# Patient Record
Sex: Male | Born: 1960 | Race: Black or African American | Hispanic: No | Marital: Single | Smoking: Former smoker
Health system: Southern US, Community
[De-identification: ages and names within clinical notes are randomized; demographics above are authoritative.]

## PROBLEM LIST (undated history)

## (undated) DIAGNOSIS — Q359 Cleft palate, unspecified: Secondary | ICD-10-CM

## (undated) DIAGNOSIS — Z789 Other specified health status: Secondary | ICD-10-CM

## (undated) HISTORY — PX: NASAL SINUS SURGERY: SHX719

## (undated) HISTORY — PX: INGUINAL HERNIA REPAIR: SUR1180

---

## 2011-07-06 ENCOUNTER — Emergency Department (HOSPITAL_COMMUNITY)
Admission: EM | Admit: 2011-07-06 | Discharge: 2011-07-06 | Disposition: A | Discharge status: Home / Self Care | Payer: Worker's Compensation | Attending: Physician Assistant | Admitting: Physician Assistant

## 2011-07-06 ENCOUNTER — Emergency Department (HOSPITAL_COMMUNITY): Payer: Worker's Compensation

## 2011-07-06 ENCOUNTER — Encounter: Payer: Self-pay | Admitting: Emergency Medicine

## 2011-07-06 DIAGNOSIS — IMO0002 Reserved for concepts with insufficient information to code with codable children: Secondary | ICD-10-CM

## 2011-07-06 DIAGNOSIS — M79609 Pain in unspecified limb: Secondary | ICD-10-CM | POA: Insufficient documentation

## 2011-07-06 DIAGNOSIS — M25579 Pain in unspecified ankle and joints of unspecified foot: Secondary | ICD-10-CM | POA: Insufficient documentation

## 2011-07-06 DIAGNOSIS — S93409A Sprain of unspecified ligament of unspecified ankle, initial encounter: Secondary | ICD-10-CM | POA: Insufficient documentation

## 2011-07-06 DIAGNOSIS — W11XXXA Fall on and from ladder, initial encounter: Secondary | ICD-10-CM | POA: Insufficient documentation

## 2011-07-06 MED ORDER — ONDANSETRON HCL 4 MG PO TABS
4.0000 mg | ORAL_TABLET | Freq: Once | ORAL | Status: AC
  Administered 2011-07-06: 4 mg via ORAL
  Filled 2011-07-06: qty 1

## 2011-07-06 MED ORDER — IBUPROFEN 800 MG PO TABS: 800.0000 mg | ORAL_TABLET | Freq: Three times a day (TID) | ORAL | Status: AC

## 2011-07-06 MED ORDER — MORPHINE SULFATE 10 MG/ML IJ SOLN
8.0000 mg | Freq: Once | INTRAMUSCULAR | Status: AC
  Administered 2011-07-06: 14:00:00 via INTRAMUSCULAR
  Filled 2011-07-06: qty 1

## 2011-07-06 MED ORDER — HYDROCODONE-ACETAMINOPHEN 5-325 MG PO TABS: ORAL_TABLET | ORAL | Status: DC

## 2011-07-06 NOTE — ED Notes (Signed)
Patient transported to X-ray 

## 2011-07-06 NOTE — ED Notes (Signed)
Crutch inst and aso applied. tol well,

## 2011-07-06 NOTE — ED Notes (Signed)
Pain rt ankle, good dp pulse, and sensation.  Iced and elevated.

## 2011-07-06 NOTE — ED Provider Notes (Signed)
History     CSN: [id number]  Arrival date & time 07/06/11  1258   None     Chief Complaint  Patient presents with  . Fall  . Leg Pain    (Consider location/radiation/quality/duration/timing/severity/associated sxs/prior treatment) Patient is a 50 y.o. male presenting with fall and leg pain. The history is provided by the patient.  Fall The accident occurred 1 to 2 hours ago. The fall occurred from a ladder. He fell from an unknown height. He landed on grass ( patient fell in a hole). Point of impact: Right ankle. The pain is at a severity of 10/10. The pain is severe. He was not ambulatory at the scene. There was no drug use involved in the accident. There was no alcohol use involved in the accident. Pertinent negatives include no visual change, no numbness, no abdominal pain, no nausea, no vomiting, no hematuria and no loss of consciousness. The symptoms are aggravated by standing. He has tried nothing for the symptoms.  Leg Pain  Pertinent negatives include no numbness.    Past Medical History  Diagnosis Date  . Hernia     Past Surgical History  Procedure Date  . Hernia repair     History reviewed. No pertinent family history.  History  Substance Use Topics  . Smoking status: Current Some Day Smoker    Types: Cigars  . Smokeless tobacco: Never Used  . Alcohol Use: 0.6 oz/week    1 Cans of beer per week      Review of Systems  Constitutional: Negative for activity change.       All ROS Neg except as noted in HPI  HENT: Negative for nosebleeds and neck pain.   Eyes: Negative for photophobia and discharge.  Respiratory: Negative for cough, shortness of breath and wheezing.   Cardiovascular: Negative for chest pain and palpitations.  Gastrointestinal: Negative for nausea, vomiting, abdominal pain and blood in stool.  Genitourinary: Negative for dysuria, frequency and hematuria.  Musculoskeletal: Negative for back pain and arthralgias.  Skin: Negative.     Neurological: Negative for dizziness, seizures, loss of consciousness, speech difficulty and numbness.  Psychiatric/Behavioral: Negative for hallucinations and confusion.    Allergies  Review of patient's allergies indicates no known allergies.  Home Medications   Current Outpatient Rx  Name Route Sig Dispense Refill  . HYDROCODONE-ACETAMINOPHEN 5-325 MG PO TABS  1 or 2 po q4h prn pain 24 tablet 0  . IBUPROFEN 800 MG PO TABS Oral Take 1 tablet (800 mg total) by mouth 3 (three) times daily. 21 tablet 0    BP 109/91  Pulse 76  Temp(Src) 97.8 F (36.6 C) (Tympanic)  Resp 18  Ht 5\' 9"  (1.753 m)  Wt 170 lb (77.111 kg)  BMI 25.10 kg/m2  SpO2 98%  Physical Exam  Nursing note and vitals reviewed. Constitutional: He is oriented to person, place, and time. He appears well-developed and well-nourished.  Non-toxic appearance.  HENT:  Head: Normocephalic.  Right Ear: Tympanic membrane and external ear normal.  Left Ear: Tympanic membrane and external ear normal.  Eyes: EOM and lids are normal. Pupils are equal, round, and reactive to light.  Neck: Normal range of motion. Neck supple. Carotid bruit is not present.  Cardiovascular: Normal rate, regular rhythm, normal heart sounds, intact distal pulses and normal pulses.   Pulmonary/Chest: Breath sounds normal. No respiratory distress.  Abdominal: Soft. Bowel sounds are normal. There is no tenderness. There is no guarding.  Musculoskeletal: Normal range of motion.  Moderate medial malleolus pain. Moderate posterior ankle pain. Achilles tendon on the left intact. Distal pulses symmetrical. Good capillary refill on the left. Sensory is symmetrical.  Lymphadenopathy:       Head (right side): No submandibular adenopathy present.       Head (left side): No submandibular adenopathy present.    He has no cervical adenopathy.  Neurological: He is alert and oriented to person, place, and time. He has normal strength. No cranial nerve deficit  or sensory deficit.  Skin: Skin is warm and dry.  Psychiatric: He has a normal mood and affect. His speech is normal.    ED Course  Procedures (including critical care time)  Labs Reviewed - No data to display Dg Tibia/fibula Right  07/06/2011  *RADIOLOGY REPORT*  Clinical Data: Larey Seat from ladder with pain  RIGHT TIBIA AND FIBULA - 2 VIEW  Comparison: None.  Findings: No acute fracture is seen.  Alignment is normal.  No significant soft tissue swelling is noted.  IMPRESSION: No fracture.  Original Report Authenticated By: Juline Patch, M.D.   Dg Ankle Complete Right  07/06/2011  *RADIOLOGY REPORT*  Clinical Data: Larey Seat from ladder  RIGHT ANKLE - COMPLETE 3+ VIEW  Comparison: None.  Findings: No acute fracture is seen.  The ankle joint appears normal.  Alignment is normal.  There may be a small ankle joint effusion present on the lateral view. A small bony dense on lateral view between the talus and navicula appears corticated and most likely is old.  IMPRESSION: No acute fracture.  Question small ankle joint effusion  Original Report Authenticated By: Juline Patch, M.D.     Dx: Ankle sprain   MDM  I have reviewed nursing notes, vital signs, and all appropriate lab and imaging results for this patient.        Kathie Dike, [place] 07/06/11 1438

## 2011-07-06 NOTE — ED Notes (Signed)
Patient c/o right lower leg/ankle pain. Patient reports falling off ladder into hole and twisting ankle. Patient denies falling or hurting any other part of body.

## 2011-07-06 NOTE — ED Notes (Signed)
Return from xray

## 2011-07-07 NOTE — ED Provider Notes (Signed)
Medical screening examination/treatment/procedure(s) were performed by non-physician practitioner and as supervising physician I was immediately available for consultation/collaboration.   Laray Anger, DO 07/07/11 [phone]

## 2012-04-11 ENCOUNTER — Emergency Department (HOSPITAL_COMMUNITY)
Admission: EM | Admit: 2012-04-11 | Discharge: 2012-04-11 | Disposition: A | Discharge status: Home / Self Care | Payer: Self-pay | Attending: Emergency Medicine | Admitting: Emergency Medicine

## 2012-04-11 ENCOUNTER — Emergency Department (HOSPITAL_COMMUNITY): Payer: Self-pay

## 2012-04-11 ENCOUNTER — Encounter (HOSPITAL_COMMUNITY): Payer: Self-pay

## 2012-04-11 DIAGNOSIS — M25469 Effusion, unspecified knee: Secondary | ICD-10-CM | POA: Insufficient documentation

## 2012-04-11 DIAGNOSIS — F17200 Nicotine dependence, unspecified, uncomplicated: Secondary | ICD-10-CM | POA: Insufficient documentation

## 2012-04-11 DIAGNOSIS — M25461 Effusion, right knee: Secondary | ICD-10-CM

## 2012-04-11 MED ORDER — KETOROLAC TROMETHAMINE 60 MG/2ML IM SOLN
60.0000 mg | Freq: Once | INTRAMUSCULAR | Status: AC
  Administered 2012-04-11: 60 mg via INTRAMUSCULAR
  Filled 2012-04-11: qty 2

## 2012-04-11 MED ORDER — ONDANSETRON HCL 4 MG PO TABS
4.0000 mg | ORAL_TABLET | Freq: Once | ORAL | Status: AC
  Administered 2012-04-11: 4 mg via ORAL
  Filled 2012-04-11: qty 1

## 2012-04-11 MED ORDER — MELOXICAM 7.5 MG PO TABS: ORAL_TABLET | ORAL | Status: DC

## 2012-04-11 MED ORDER — HYDROCODONE-ACETAMINOPHEN 5-325 MG PO TABS
2.0000 | ORAL_TABLET | Freq: Once | ORAL | Status: AC
  Administered 2012-04-11: 2 via ORAL
  Filled 2012-04-11: qty 2

## 2012-04-11 MED ORDER — HYDROCODONE-ACETAMINOPHEN 5-325 MG PO TABS: 1.0000 | ORAL_TABLET | ORAL | Status: DC | PRN

## 2012-04-11 NOTE — ED Provider Notes (Signed)
History     CSN: [id number]  Arrival date & time 04/11/12  2120   First MD Initiated Contact with Patient 04/11/12 2133      Chief Complaint  Patient presents with  . Knee Pain    (Consider location/radiation/quality/duration/timing/severity/associated sxs/prior treatment) Patient is a 51 y.o. male presenting with knee pain. The history is provided by the patient.  Knee Pain This is a chronic (This has been a problem "off and on" for 8 months.) problem. The current episode started more than 1 month ago. The problem occurs daily. The problem has been gradually worsening. Associated symptoms include arthralgias. Pertinent negatives include no abdominal pain, chest pain, coughing, fever, nausea or neck pain. The symptoms are aggravated by bending, standing and walking. He has tried acetaminophen for the symptoms. The treatment provided no relief.    Past Medical History  Diagnosis Date  . Hernia     Past Surgical History  Procedure Date  . Hernia repair     History reviewed. No pertinent family history.  History  Substance Use Topics  . Smoking status: Current Some Day Smoker    Types: Cigars  . Smokeless tobacco: Never Used  . Alcohol Use: 0.6 oz/week    1 Cans of beer per week      Review of Systems  Constitutional: Negative for fever and activity change.       All ROS Neg except as noted in HPI  HENT: Negative for nosebleeds and neck pain.   Eyes: Negative for photophobia and discharge.  Respiratory: Negative for cough, shortness of breath and wheezing.   Cardiovascular: Negative for chest pain and palpitations.  Gastrointestinal: Negative for nausea, abdominal pain and blood in stool.  Genitourinary: Negative for dysuria, frequency and hematuria.  Musculoskeletal: Positive for arthralgias. Negative for back pain.  Skin: Negative.   Neurological: Negative for dizziness, seizures and speech difficulty.  Psychiatric/Behavioral: Negative for hallucinations and  confusion.    Allergies  Review of patient's allergies indicates no known allergies.  Home Medications   Current Outpatient Rx  Name Route Sig Dispense Refill  . HYDROCODONE-ACETAMINOPHEN 5-325 MG PO TABS  1 or 2 po q4h prn pain 24 tablet 0  . HYDROCODONE-ACETAMINOPHEN 5-325 MG PO TABS Oral Take 1 tablet by mouth every 4 (four) hours as needed for pain. 20 tablet 0  . MELOXICAM 7.5 MG PO TABS  1 po bid with food 12 tablet 0    BP 113/78  Pulse 85  Temp 98.1 F (36.7 C) (Oral)  Resp 20  Ht 5\' 8"  (1.727 m)  Wt 170 lb (77.111 kg)  BMI 25.85 kg/m2  SpO2 98%  Physical Exam  Nursing note and vitals reviewed. Constitutional: He is oriented to person, place, and time. He appears well-developed and well-nourished.  Non-toxic appearance.  HENT:  Head: Normocephalic.  Right Ear: Tympanic membrane and external ear normal.  Left Ear: Tympanic membrane and external ear normal.  Eyes: EOM and lids are normal. Pupils are equal, round, and reactive to light.  Neck: Normal range of motion. Neck supple. Carotid bruit is not present.  Cardiovascular: Normal rate, regular rhythm, intact distal pulses and normal pulses.   Murmur heard. Pulmonary/Chest: Breath sounds normal. No respiratory distress.  Abdominal: Soft. Bowel sounds are normal. There is no tenderness. There is no guarding.  Musculoskeletal: Normal range of motion.       There is an effusion of the right knee. The knee is warm to touch but not hot. There is mild  to moderate crepitus with flexion and extension of the knee. There is no posterior mass appreciated. There's no deformity of the quadricep area nor the anterior tibial tuberosity. The dorsalis pedis pulses are symmetrical.  Lymphadenopathy:       Head (right side): No submandibular adenopathy present.       Head (left side): No submandibular adenopathy present.    He has no cervical adenopathy.  Neurological: He is alert and oriented to person, place, and time. He has normal  strength. No cranial nerve deficit or sensory deficit.  Skin: Skin is warm and dry.  Psychiatric: He has a normal mood and affect. His speech is normal.    ED Course  Procedures (including critical care time)  Labs Reviewed - No data to display No results found.   1. Knee effusion, right       MDM  I have reviewed nursing notes, vital signs, and all appropriate lab and imaging results for this patient. Patient reports that for approximately 8 months he has been having" off and on" pain and at times mild swelling of the right knee. He has had some previous problems with his right ankle as well. The patient has not had this evaluated up to this point. The patient denies any high fevers or chills recently. His been no injury or trauma to the knee. The patient states he does a lot of walking and bending.  The plan at this time is for the patient to be fitted with a knee immobilizer. He history with Mobic 7.5 mg 2 times daily with food, and Norco 5 mg #20 tablets. The patient is asked to see the orthopedist for additional evaluation and management of this effusion and the source of the effusion.       Kathie Dike, [place] 04/11/12 2154

## 2012-04-11 NOTE — ED Notes (Signed)
Patient with no complaints at this time. Respirations even and unlabored. Skin warm/dry. Discharge instructions reviewed with patient at this time. Patient given opportunity to voice concerns/ask questions. Patient discharged at this time and left Emergency Department with steady gait.   

## 2012-04-11 NOTE — ED Notes (Signed)
Patient fell in December of last year and injured R ankle.  He has had mild knee pain since that time.  Recently it has become more severe and has become tight and swollen.  Knee is swollen and warm.

## 2012-04-11 NOTE — ED Notes (Signed)
Right knee swollen and hurting per pt.

## 2012-04-13 NOTE — ED Provider Notes (Signed)
Medical screening examination/treatment/procedure(s) were performed by non-physician practitioner and as supervising physician I was immediately available for consultation/collaboration.  Flint Melter, MD 04/13/12 1257

## 2013-11-20 ENCOUNTER — Encounter (HOSPITAL_COMMUNITY): Payer: Self-pay | Admitting: Emergency Medicine

## 2013-11-20 ENCOUNTER — Emergency Department (HOSPITAL_COMMUNITY)
Admission: EM | Admit: 2013-11-20 | Discharge: 2013-11-20 | Disposition: A | Discharge status: Home / Self Care | Payer: BC Managed Care – PPO | Attending: Emergency Medicine | Admitting: Emergency Medicine

## 2013-11-20 DIAGNOSIS — K429 Umbilical hernia without obstruction or gangrene: Secondary | ICD-10-CM

## 2013-11-20 DIAGNOSIS — N5089 Other specified disorders of the male genital organs: Secondary | ICD-10-CM | POA: Insufficient documentation

## 2013-11-20 DIAGNOSIS — K4090 Unilateral inguinal hernia, without obstruction or gangrene, not specified as recurrent: Secondary | ICD-10-CM | POA: Insufficient documentation

## 2013-11-20 DIAGNOSIS — F17200 Nicotine dependence, unspecified, uncomplicated: Secondary | ICD-10-CM | POA: Insufficient documentation

## 2013-11-20 MED ORDER — HYDROCODONE-ACETAMINOPHEN 5-325 MG PO TABS: 1.0000 | ORAL_TABLET | ORAL | Status: DC | PRN

## 2013-11-20 NOTE — ED Provider Notes (Signed)
CSN: [id number]     Arrival date & time 11/20/13  1930 History   First MD Initiated Contact with Patient 11/20/13 2230     Chief Complaint  Patient presents with  . Inguinal Hernia     (Consider location/radiation/quality/duration/timing/severity/associated sxs/prior Treatment) Patient is a 53 y.o. male presenting with male genitourinary complaint. The history is provided by the patient.  Male GU Problem Presenting symptoms: no penile pain and no scrotal pain   Presenting symptoms comment:  Left groin pain Relieved by:  Nothing Worsened by:  Nothing tried Ineffective treatments:  None tried Associated symptoms: scrotal swelling   Associated symptoms: no abdominal pain   Risk factors comment:  Inguiinal hernia on left   Past Medical History  Diagnosis Date  . Hernia    Past Surgical History  Procedure Laterality Date  . Hernia repair     No family history on file. History  Substance Use Topics  . Smoking status: Current Some Day Smoker    Types: Cigars, Cigarettes  . Smokeless tobacco: Never Used  . Alcohol Use: 0.6 oz/week    1 Cans of beer per week    Review of Systems  Gastrointestinal: Negative for abdominal pain.  Genitourinary: Positive for scrotal swelling. Negative for penile pain.  All other systems reviewed and are negative.     Allergies  Review of patient's allergies indicates no known allergies.  Home Medications   Prior to Admission medications   Not on File   BP 126/73  Pulse 68  Temp(Src) 98.3 F (36.8 C) (Oral)  Resp 20  Ht 5\' 5"  (1.651 m)  Wt 153 lb (69.4 kg)  BMI 25.46 kg/m2  SpO2 99% Physical Exam  Constitutional: He is oriented to person, place, and time. He appears well-developed and well-nourished. No distress.  HENT:  Head: Normocephalic and atraumatic.  Eyes: Conjunctivae are normal.  Neck: Neck supple. No tracheal deviation present.  Cardiovascular: Normal rate and regular rhythm.   Pulmonary/Chest: Effort normal. No  respiratory distress.  Abdominal: Soft. He exhibits no distension. There is no tenderness. A hernia is present. Hernia confirmed positive in the ventral area (minimal just superior to umbilicus) and confirmed positive in the left inguinal area (reducible).  Neurological: He is alert and oriented to person, place, and time.  Skin: Skin is warm and dry.  Psychiatric: He has a normal mood and affect.    ED Course  Procedures (including critical care time) Labs Review Labs Reviewed - No data to display  Imaging Review No results found.   EKG Interpretation None      MDM   Final diagnoses:  Left inguinal hernia  Periumbilical hernia   53 y.o. male presents with left inguinal hernia that he has noticed becoming larger over the last few years. He has had one on the right side remotely that was repaired surgically. Also states has "knot" near umbilicus. Palpable bulging left hernia without tenderness and easily reducible. No ischemic pain, no indication for lab work or urgent surgical consultation. Uncomplicated early periumbilical hernia that is painless. Recommended Pt see a general surgeon non-emergently for evaluation of symptomatic inguinal hernia and can establish primary care physician to track umbilical hernia that will not require surgical repair as is asymptomatic currently. Discharged in good condition with short course of pain meds for symptomatic control during activities that exacerbate pain.    Leo Grosser, MD 11/21/13 [phone]

## 2013-11-20 NOTE — ED Notes (Signed)
Pt reports left inguinal hernia x "5 months" but states it has progressively gotten larger and states pain is increasing. Rates 5/10. States "I want to get check out." Denies being seen for same. Denies N/V/D. NAD. AO x4.

## 2013-11-20 NOTE — Discharge Instructions (Signed)

## 2013-11-21 NOTE — ED Provider Notes (Signed)
I saw and evaluated the patient, reviewed the resident's note and I agree with the findings and plan.   EKG Interpretation None      Reducible inguinal hernia.  Patient be referred to general surgery.  Hoy Morn, MD 11/21/13 [phone]

## 2013-12-03 ENCOUNTER — Ambulatory Visit: Payer: Self-pay

## 2013-12-12 ENCOUNTER — Ambulatory Visit (INDEPENDENT_AMBULATORY_CARE_PROVIDER_SITE_OTHER): Payer: BC Managed Care – PPO | Admitting: Family Medicine

## 2013-12-12 ENCOUNTER — Encounter: Payer: Self-pay | Admitting: Family Medicine

## 2013-12-12 VITALS — BP 122/75 | HR 73 | Temp 98.0°F | Resp 20 | Ht 69.0 in | Wt 151.0 lb

## 2013-12-12 DIAGNOSIS — R5383 Other fatigue: Secondary | ICD-10-CM

## 2013-12-12 DIAGNOSIS — J3489 Other specified disorders of nose and nasal sinuses: Secondary | ICD-10-CM

## 2013-12-12 DIAGNOSIS — R5381 Other malaise: Secondary | ICD-10-CM

## 2013-12-12 DIAGNOSIS — Z1211 Encounter for screening for malignant neoplasm of colon: Secondary | ICD-10-CM

## 2013-12-12 DIAGNOSIS — R0981 Nasal congestion: Secondary | ICD-10-CM

## 2013-12-12 DIAGNOSIS — K4090 Unilateral inguinal hernia, without obstruction or gangrene, not specified as recurrent: Secondary | ICD-10-CM

## 2013-12-12 LAB — CBC
HCT: 44.7 % (ref 39.0–52.0)
HEMOGLOBIN: 15.4 g/dL (ref 13.0–17.0)
MCH: 29.5 pg (ref 26.0–34.0)
MCHC: 34.5 g/dL (ref 30.0–36.0)
MCV: 85.6 fL (ref 78.0–100.0)
PLATELETS: 242 10*3/uL (ref 150–400)
RBC: 5.22 MIL/uL (ref 4.22–5.81)
RDW: 14.5 % (ref 11.5–15.5)
WBC: 5.4 10*3/uL (ref 4.0–10.5)

## 2013-12-12 NOTE — Progress Notes (Signed)
Subjective:    Patient ID: Adam Gonzalez, male    DOB: 12/28/1960, 53 y.o.   MRN: [id number]  HPI Patient is in the office to establish care. Patient reports that he moved to this area from [place] 2 years ago, and has not had a primary physician.  Patient complaining of a left inguinal hernia. Reports that he was examined by the  emergency room on 11/20/2013. States that the hernia was reduced and he was sent home with pain medication. Patient reports that he did not get the prescription filled. He states that discomfort primarily occurs with strenuous activity. He is currently not complaining of pain.   Review of Systems  Constitutional: Positive for fatigue. Negative for fever and unexpected weight change.  HENT: Negative.   Eyes: Negative.   Respiratory: Negative.   Cardiovascular: Negative.   Gastrointestinal: Negative.   Endocrine: Negative.   Genitourinary: Negative.  Negative for urgency.  Musculoskeletal: Negative.   Skin: Negative.   Allergic/Immunologic: Negative.   Neurological: Negative.   Hematological: Negative.   Psychiatric/Behavioral: Negative.        Objective:   Physical Exam  Constitutional: He is oriented to person, place, and time. He appears well-developed and well-nourished.  HENT:  Head: Normocephalic and atraumatic.  Right Ear: Hearing, tympanic membrane and external ear normal.  Left Ear: Hearing, tympanic membrane and external ear normal.  Nose: Mucosal edema and nasal deformity present. No sinus tenderness.  Mouth/Throat: Uvula is midline, oropharynx is clear and moist and mucous membranes are normal.  Eyes: Conjunctivae, EOM and lids are normal. Pupils are equal, round, and reactive to light. Lids are everted and swept, no foreign bodies found.  Neck: Trachea normal and normal range of motion. Neck supple.  Pulmonary/Chest: Effort normal and breath sounds normal.  Abdominal: Soft. Bowel sounds are normal. There is tenderness in the right upper  quadrant. There is no CVA tenderness. A hernia is present. Hernia confirmed positive in the left inguinal area.  Genitourinary: Testes normal and penis normal.    Circumcised.  Musculoskeletal: Normal range of motion.  Neurological: He is alert and oriented to person, place, and time. He has normal reflexes.  Skin: Skin is warm, dry and intact.  Psychiatric: He has a normal mood and affect. His speech is normal and behavior is normal.          Assessment & Plan:  1. Left Injuinal hernia: Patient states that hernia causes discomfort and is present with coughing, laughing and exercising. Reports that he has been refraining from exercising over the past month due to discomfort. Patient was seen in the emergency department and prescribed Percocet. He felt that the discomfort did not warrant that type of medication, so he did not have it filled. Patient reports that he had a right hernia repair years ago and is concerned that he may have to have a repair on the left. Patient may require a surgical consult. Will consult with Dr. Zigmund Daniel.   2. Nasal congestion: Reports that he has nasal congestion primarily upon awakening, unrelieved by saline. Given sample of Astepro 0.15%, one spray to each nare daily as needed.   3. Fatigue-Patient maintains that he feels increasingly tired on most days. He has not had a physical examination in greater than 2 years. Will check TSH, CMP, and CBC.    Preventions:  Immunizations: Up to date. Patient reports that he had a TDap 6 years ago.   Colonoscopy: Patient has never had colonoscopy,  will refer  for a screening colonoscopy  Labs: TSH, CMP, CBC  Will RTC in 3 months for CPE with Dr. Zigmund Daniel  Dorena Dew, FNP

## 2013-12-13 LAB — COMPLETE METABOLIC PANEL WITH GFR
ALBUMIN: 4.2 g/dL (ref 3.5–5.2)
ALT: 14 U/L (ref 0–53)
AST: 18 U/L (ref 0–37)
Alkaline Phosphatase: 51 U/L (ref 39–117)
BUN: 14 mg/dL (ref 6–23)
CALCIUM: 9.4 mg/dL (ref 8.4–10.5)
CHLORIDE: 102 meq/L (ref 96–112)
CO2: 25 mEq/L (ref 19–32)
Creat: 0.98 mg/dL (ref 0.50–1.35)
GFR, Est African American: >89 mL/min
GFR, Est Non African American: 89 mL/min
Glucose, Bld: 85 mg/dL (ref 70–99)
POTASSIUM: 4.2 meq/L (ref 3.5–5.3)
Sodium: 140 mEq/L (ref 135–145)
Total Bilirubin: 0.6 mg/dL (ref 0.2–1.2)
Total Protein: 6.9 g/dL (ref 6.0–8.3)

## 2013-12-13 LAB — TSH: TSH: 0.916 u[IU]/mL (ref 0.350–4.500)

## 2013-12-15 ENCOUNTER — Telehealth (HOSPITAL_COMMUNITY): Payer: Self-pay | Admitting: Family Medicine

## 2013-12-15 NOTE — Telephone Encounter (Signed)
Reviewed labs, notified patient.

## 2013-12-16 ENCOUNTER — Telehealth: Payer: Self-pay

## 2013-12-16 NOTE — Telephone Encounter (Signed)
Pt was contacted and told of his Labs being within good standing.Pt understood results w/ no problem.Pt was also told that when Referrals are sent off those offices would notify him of date /Time

## 2013-12-17 DIAGNOSIS — R5381 Other malaise: Secondary | ICD-10-CM | POA: Insufficient documentation

## 2013-12-17 DIAGNOSIS — Z1211 Encounter for screening for malignant neoplasm of colon: Secondary | ICD-10-CM | POA: Insufficient documentation

## 2013-12-17 DIAGNOSIS — R0981 Nasal congestion: Secondary | ICD-10-CM | POA: Insufficient documentation

## 2013-12-17 DIAGNOSIS — K4090 Unilateral inguinal hernia, without obstruction or gangrene, not specified as recurrent: Secondary | ICD-10-CM | POA: Insufficient documentation

## 2013-12-17 DIAGNOSIS — R5383 Other fatigue: Secondary | ICD-10-CM

## 2013-12-17 MED ORDER — AZELASTINE HCL 0.15 % NA SOLN: 1.0000 | Freq: Once | NASAL | Status: DC

## 2014-01-19 ENCOUNTER — Telehealth: Payer: Self-pay

## 2014-01-19 NOTE — Telephone Encounter (Signed)
Pt's Notes, Demo, Labs were all faxed over to [place] is in need of Colonoscopy.Pt will be contacted when Paper work has been recieved as well as reviewed.

## 2014-01-29 ENCOUNTER — Telehealth: Payer: Self-pay

## 2014-01-29 ENCOUNTER — Telehealth: Payer: Self-pay | Admitting: Internal Medicine

## 2014-01-29 DIAGNOSIS — K4090 Unilateral inguinal hernia, without obstruction or gangrene, not specified as recurrent: Secondary | ICD-10-CM

## 2014-01-29 NOTE — Telephone Encounter (Signed)
Fax was recieved today @ this office notifying about above Pt. having recieved appointment to see Dr. Hung@10 :20 AM(01/29/2014)@[place]  [place] stated Pt was Advised of this.

## 2014-01-29 NOTE — Telephone Encounter (Signed)
Patient called stating Dr Benson Norway was unable to complete colonoscopy due to existing hernia. Patient would like referrals to have hernia removed and existing back pain.

## 2014-01-29 NOTE — Telephone Encounter (Signed)
Call Documentation     Glori Bickers at 01/29/2014 2:39 PM     Status: Signed        Patient called stating Dr Benson Norway was unable to complete colonoscopy due to existing hernia. Patient would like referrals to have hernia removed and existing back pain.    Plz see existing Notations prior to this Msg.Thank you.

## 2014-02-03 ENCOUNTER — Telehealth: Payer: Self-pay

## 2014-02-03 NOTE — Telephone Encounter (Signed)
Called and spoke with patient to let him know we were following up on his previous referral to General Surgery. He states he already has an appointment with Dr. Fanny Skates on 02/19/2014 for Consult. Thanks!

## 2014-02-03 NOTE — Telephone Encounter (Signed)
Called and spoke with patient to let him know we were following up on his previous referral to General Surgery. He states he already has an appointment with Dr. Fanny Skates on 02/19/2014 for  Consult. Thanks!

## 2014-02-19 ENCOUNTER — Encounter (INDEPENDENT_AMBULATORY_CARE_PROVIDER_SITE_OTHER): Payer: Self-pay | Admitting: General Surgery

## 2014-02-19 ENCOUNTER — Ambulatory Visit (INDEPENDENT_AMBULATORY_CARE_PROVIDER_SITE_OTHER): Payer: BC Managed Care – PPO | Admitting: General Surgery

## 2014-02-19 ENCOUNTER — Other Ambulatory Visit (INDEPENDENT_AMBULATORY_CARE_PROVIDER_SITE_OTHER): Payer: Self-pay

## 2014-02-19 ENCOUNTER — Telehealth (INDEPENDENT_AMBULATORY_CARE_PROVIDER_SITE_OTHER): Payer: Self-pay | Admitting: General Surgery

## 2014-02-19 VITALS — BP 136/76 | HR 77 | Temp 98.0°F | Resp 18 | Ht 71.0 in | Wt 150.0 lb

## 2014-02-19 DIAGNOSIS — K4090 Unilateral inguinal hernia, without obstruction or gangrene, not specified as recurrent: Secondary | ICD-10-CM

## 2014-02-19 DIAGNOSIS — R109 Unspecified abdominal pain: Secondary | ICD-10-CM

## 2014-02-19 MED ORDER — HYDROCODONE-ACETAMINOPHEN 5-325 MG PO TABS: 1.0000 | ORAL_TABLET | Freq: Four times a day (QID) | ORAL | Status: DC | PRN

## 2014-02-19 NOTE — Progress Notes (Signed)
Patient ID: Adam Gonzalez, male   DOB: Jul 06, 1961, 53 y.o.   MRN: [id number]  Chief Complaint  Patient presents with  . New Evaluation    hernia    HPI Adam Gonzalez is a 53 y.o. male.  He is referred by Adam Sickle, FNP for evaluation of a painful left inguinal hernia  The patient states that he's had a painful bulge in his left groin for about 8 months. It's getting bigger and is having trouble working. When he was examined and they told him that he might have an umbilical hernia but he has no pain or symptoms there.  Has a history of right inguinal hernia repair 20 years ago. He does not know the details. He does not remember having any surgery at his umbilicus.  He is otherwise healthy.Does not have a Gonzalez cell disease.  He is single,  has 4 children. Smokes rarely. Drinks about 6 beers a week. 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  HPI  Past Medical History  Diagnosis Date  . Hernia     Past Surgical History  Procedure Laterality Date  . Hernia repair      No family history on file.  Social History History  Substance Use Topics  . Smoking status: Current Some Day Smoker    Types: Cigars, Cigarettes  . Smokeless tobacco: Never Used  . Alcohol Use: 0.6 oz/week    1 Cans of beer per week    No Known Allergies  Current Outpatient Prescriptions  Medication Sig Dispense Refill  . Azelastine HCl (ASTEPRO) 0.15 % SOLN Place 1 spray into the nose once.      Marland Kitchen HYDROcodone-acetaminophen (NORCO/VICODIN) 5-325 MG per tablet Take 1 tablet by mouth every 4 (four) hours as needed for moderate pain.  6 tablet  0   No current facility-administered medications for this visit.    Review of Systems Review of Systems  Constitutional: Negative for fever, chills and unexpected weight change.  HENT: Negative for congestion, hearing loss, sore throat, trouble swallowing and voice change.   Eyes: Negative for visual disturbance.  Respiratory: Negative for  cough and wheezing.   Cardiovascular: Negative for chest pain, palpitations and leg swelling.  Gastrointestinal: Negative for nausea, vomiting, abdominal pain, diarrhea, constipation, blood in stool, abdominal distention, anal bleeding and rectal pain.  Genitourinary: Negative for hematuria and difficulty urinating.  Musculoskeletal: Negative for arthralgias.  Skin: Negative for rash and wound.  Neurological: Negative for seizures, syncope, weakness and headaches.  Hematological: Negative for adenopathy. Does not bruise/bleed easily.  Psychiatric/Behavioral: Negative for confusion.    Blood pressure 136/76, pulse 77, temperature 98 F (36.7 C), resp. rate 18, height 5\' 11"  (1.803 m), weight 150 lb (68.04 kg).  Physical Exam Physical Exam  Constitutional: He is oriented to person, place, and time. He appears well-developed and well-nourished. No distress.  HENT:  Head: Normocephalic.  Nose: Nose normal.  Mouth/Throat: No oropharyngeal exudate.  Eyes: Conjunctivae and EOM are normal. Pupils are equal, round, and reactive to light. Right eye exhibits no discharge. Left eye exhibits no discharge. No scleral icterus.  Neck: Normal range of motion. Neck supple. No JVD present. No tracheal deviation present. No thyromegaly present.  Cardiovascular: Normal rate, regular rhythm, normal heart sounds and intact distal pulses.   No murmur heard. Pulmonary/Chest: Effort normal and breath sounds normal. No stridor. No respiratory distress. He has no wheezes. He has no rales. He exhibits no tenderness.  Abdominal: Soft. Bowel sounds are  normal. He exhibits no distension and no mass. There is no tenderness. There is no rebound and no guarding.  I do not feel a hernia at his umbilicus. There is no tenderness at his umbilicus. It looks like he has a transverse scar at the superior umbilical rim.  Genitourinary:  Large left inguinal hernia, reducible when supine. Large scar right groin. Well-healed. No  evidence of recurrent hernia. No scrotal mass.  Musculoskeletal: Normal range of motion. He exhibits no edema and no tenderness.  Lymphadenopathy:    He has no cervical adenopathy.  Neurological: He is alert and oriented to person, place, and time. He has normal reflexes. Coordination normal.  Skin: Skin is warm and dry. No rash noted. He is not diaphoretic. No erythema. No pallor.  Psychiatric: He has a normal mood and affect. His behavior is normal. Judgment and thought content normal.    Data Reviewed [facility] notes from [facility], Smith Robert, FNP  Assessment    Symptomatic left inguinal hernia, reducible  Remote history right inguinal hernia repair, no evidence of recurrence     Plan    He is anxious to have this repaired as soon as possible, and so we are going to see if we can get this on the schedule for next week  He will reschedule for open repair of left inguinal hernia with mesh  Discussed the indications, details, techniques, and numerous risks of the surgery with him. He is aware of the risk of bleeding, infection, recurrence, injury to the intestine or testicle with major reconstructive surgery, and other option problems. He understands all these issues. All his questions are answered. He agrees with this plan.  He's been told that he is restricted to light duty for one month.        Adam Gonzalez M 02/19/2014, 10:10 AM

## 2014-02-19 NOTE — Patient Instructions (Signed)
You have a left inguinal hernia which is causing her pain.  He will be scheduled for open repair of your left inguinal hernia with mesh in the future, possibly next week.  Postop you'll be able to return to light duty in one week, but he will have to wait a full 30 days for full duty.     Inguinal Hernia, Adult Muscles help keep everything in the body in its proper place. But if a weak spot in the muscles develops, something can poke through. That is called a hernia. When this happens in the lower part of the belly (abdomen), it is called an inguinal hernia. (It takes its name from a part of the body in this region called the inguinal canal.) A weak spot in the wall of muscles lets some fat or part of the small intestine bulge through. An inguinal hernia can develop at any age. Men get them more often than women. CAUSES  In adults, an inguinal hernia develops over time.  It can be triggered by:  Suddenly straining the muscles of the lower abdomen.  Lifting heavy objects.  Straining to have a bowel movement. Difficult bowel movements (constipation) can lead to this.  Constant coughing. This may be caused by smoking or lung disease.  Being overweight.  Being pregnant.  Working at a job that requires long periods of standing or heavy lifting.  Having had an inguinal hernia before. One type can be an emergency situation. It is called a strangulated inguinal hernia. It develops if part of the small intestine slips through the weak spot and cannot get back into the abdomen. The blood supply can be cut off. If that happens, part of the intestine may die. This situation requires emergency surgery. SYMPTOMS  Often, a small inguinal hernia has no symptoms. It is found when a healthcare provider does a physical exam. Larger hernias usually have symptoms.   In adults, symptoms may include:  A lump in the groin. This is easier to see when the person is standing. It might disappear when lying  down.  In men, a lump in the scrotum.  Pain or burning in the groin. This occurs especially when lifting, straining or coughing.  A dull ache or feeling of pressure in the groin.  Signs of a strangulated hernia can include:  A bulge in the groin that becomes very painful and tender to the touch.  A bulge that turns red or purple.  Fever, nausea and vomiting.  Inability to have a bowel movement or to pass gas. DIAGNOSIS  To decide if you have an inguinal hernia, a healthcare provider will probably do a physical examination.  This will include asking questions about any symptoms you have noticed.  The healthcare provider might feel the groin area and ask you to cough. If an inguinal hernia is felt, the healthcare provider may try to slide it back into the abdomen.  Usually no other tests are needed. TREATMENT  Treatments can vary. The size of the hernia makes a difference. Options include:  Watchful waiting. This is often suggested if the hernia is small and you have had no symptoms.  No medical procedure will be done unless symptoms develop.  You will need to watch closely for symptoms. If any occur, contact your healthcare provider right away.  Surgery. This is used if the hernia is larger or you have symptoms.  Open surgery. This is usually an outpatient procedure (you will not stay overnight in a hospital). An  cut (incision) is made through the skin in the groin. The hernia is put back inside the abdomen. The weak area in the muscles is then repaired by herniorrhaphy or hernioplasty. Herniorrhaphy: in this type of surgery, the weak muscles are sewn back together. Hernioplasty: a patch or mesh is used to close the weak area in the abdominal wall.  Laparoscopy. In this procedure, a surgeon makes small incisions. A thin tube with a tiny video camera (called a laparoscope) is put into the abdomen. The surgeon repairs the hernia with mesh by looking with the video camera and using  two long instruments. HOME CARE INSTRUCTIONS   After surgery to repair an inguinal hernia:  You will need to take pain medicine prescribed by your healthcare provider. Follow all directions carefully.  You will need to take care of the wound from the incision.  Your activity will be restricted for awhile. This will probably include no heavy lifting for several weeks. You also should not do anything too active for a few weeks. When you can return to work will depend on the type of job that you have.  During "watchful waiting" periods, you should:  Maintain a healthy weight.  Eat a diet high in fiber (fruits, vegetables and whole grains).  Drink plenty of fluids to avoid constipation. This means drinking enough water and other liquids to keep your urine clear or pale yellow.  Do not lift heavy objects.  Do not stand for long periods of time.  Quit smoking. This should keep you from developing a frequent cough. SEEK MEDICAL CARE IF:   A bulge develops in your groin area.  You feel pain, a burning sensation or pressure in the groin. This might be worse if you are lifting or straining.  You develop a fever of more than 100.5 F (38.1 C). SEEK IMMEDIATE MEDICAL CARE IF:   Pain in the groin increases suddenly.  A bulge in the groin gets bigger suddenly and does not go down.  For men, there is sudden pain in the scrotum. Or, the size of the scrotum increases.  A bulge in the groin area becomes red or purple and is painful to touch.  You have nausea or vomiting that does not go away.  You feel your heart beating much faster than normal.  You cannot have a bowel movement or pass gas.  You develop a fever of more than 102.0 F (38.9 C). Document Released: 11/19/2008 Document Revised: 09/25/2011 Document Reviewed: 11/19/2008 [facility] Patient Information 2015 [place], [place]. This information is not intended to replace advice given to you by your health care provider. Make sure  you discuss any questions you have with your health care provider.

## 2014-02-19 NOTE — Telephone Encounter (Signed)
Called patient about scheduling surgery, went over financial responsibilities, patient will call back when ready to schedule.

## 2014-03-19 ENCOUNTER — Ambulatory Visit: Payer: Self-pay | Admitting: Internal Medicine

## 2014-04-27 ENCOUNTER — Encounter: Payer: Self-pay | Admitting: Internal Medicine

## 2015-03-11 ENCOUNTER — Encounter (HOSPITAL_COMMUNITY): Payer: Self-pay

## 2015-03-11 ENCOUNTER — Emergency Department (HOSPITAL_COMMUNITY)
Admission: EM | Admit: 2015-03-11 | Discharge: 2015-03-11 | Disposition: A | Discharge status: Home / Self Care | Payer: 59 | Attending: Emergency Medicine | Admitting: Emergency Medicine

## 2015-03-11 DIAGNOSIS — K4090 Unilateral inguinal hernia, without obstruction or gangrene, not specified as recurrent: Secondary | ICD-10-CM | POA: Diagnosis not present

## 2015-03-11 DIAGNOSIS — Z79899 Other long term (current) drug therapy: Secondary | ICD-10-CM | POA: Diagnosis not present

## 2015-03-11 DIAGNOSIS — N508 Other specified disorders of male genital organs: Secondary | ICD-10-CM | POA: Diagnosis not present

## 2015-03-11 DIAGNOSIS — Z720 Tobacco use: Secondary | ICD-10-CM | POA: Diagnosis not present

## 2015-03-11 DIAGNOSIS — R1030 Lower abdominal pain, unspecified: Secondary | ICD-10-CM | POA: Diagnosis present

## 2015-03-11 LAB — CBC WITH DIFFERENTIAL/PLATELET
BASOS PCT: 1 % (ref 0–1)
Basophils Absolute: 0.1 10*3/uL (ref 0.0–0.1)
EOS ABS: 0.2 10*3/uL (ref 0.0–0.7)
Eosinophils Relative: 3 % (ref 0–5)
HCT: 47.3 % (ref 39.0–52.0)
Hemoglobin: 16 g/dL (ref 13.0–17.0)
Lymphocytes Relative: 18 % (ref 12–46)
Lymphs Abs: 1.1 10*3/uL (ref 0.7–4.0)
MCH: 30.1 pg (ref 26.0–34.0)
MCHC: 33.8 g/dL (ref 30.0–36.0)
MCV: 88.9 fL (ref 78.0–100.0)
MONO ABS: 0.5 10*3/uL (ref 0.1–1.0)
MONOS PCT: 9 % (ref 3–12)
Neutro Abs: 4.2 10*3/uL (ref 1.7–7.7)
Neutrophils Relative %: 69 % (ref 43–77)
PLATELETS: 255 10*3/uL (ref 150–400)
RBC: 5.32 MIL/uL (ref 4.22–5.81)
RDW: 13 % (ref 11.5–15.5)
WBC: 6 10*3/uL (ref 4.0–10.5)

## 2015-03-11 LAB — COMPREHENSIVE METABOLIC PANEL
ALBUMIN: 4.2 g/dL (ref 3.5–5.0)
ALT: 18 U/L (ref 17–63)
ANION GAP: 9 (ref 5–15)
AST: 29 U/L (ref 15–41)
Alkaline Phosphatase: 57 U/L (ref 38–126)
BUN: 10 mg/dL (ref 6–20)
CO2: 28 mmol/L (ref 22–32)
Calcium: 9.4 mg/dL (ref 8.9–10.3)
Chloride: 102 mmol/L (ref 101–111)
Creatinine, Ser: 1.16 mg/dL (ref 0.61–1.24)
GFR calc Af Amer: >60 mL/min (ref >60)
GFR calc non Af Amer: >60 mL/min (ref >60)
GLUCOSE: 97 mg/dL (ref 65–99)
POTASSIUM: 4 mmol/L (ref 3.5–5.1)
SODIUM: 139 mmol/L (ref 135–145)
TOTAL PROTEIN: 7.3 g/dL (ref 6.5–8.1)
Total Bilirubin: 0.9 mg/dL (ref 0.3–1.2)

## 2015-03-11 LAB — I-STAT CG4 LACTIC ACID, ED: Lactic Acid, Venous: 0.98 mmol/L (ref 0.5–2.0)

## 2015-03-11 MED ORDER — HYDROCODONE-ACETAMINOPHEN 5-325 MG PO TABS
1.0000 | ORAL_TABLET | Freq: Once | ORAL | Status: AC
Start: 2015-03-11 — End: 2015-03-11
  Administered 2015-03-11: 1 via ORAL
  Filled 2015-03-11: qty 1

## 2015-03-11 MED ORDER — HYDROCODONE-ACETAMINOPHEN 5-325 MG PO TABS: 1.0000 | ORAL_TABLET | Freq: Four times a day (QID) | ORAL | Status: DC | PRN

## 2015-03-11 NOTE — ED Notes (Signed)
Ice applied to groin area

## 2015-03-11 NOTE — Discharge Instructions (Signed)

## 2015-03-11 NOTE — ED Notes (Addendum)
Pt has a inguinal hernia on the right side before but over the past month or so has been noticing pain and bulging on the left side of his pelvic area. Is having abd pain also. No nausea or vomiting.

## 2015-03-11 NOTE — ED Notes (Signed)
Patient presents stating he has a large hernia (large swollen area noted to lower abd - center)  States he did not have a BM Wednesday which is abnormal.  No difficulty with urination.

## 2015-03-11 NOTE — ED Provider Notes (Signed)
CSN: [id number]     Arrival date & time 03/11/15  0038 History  This chart was scribed for Adam Rice, MD by Meriel Pica, ED Scribe. This patient was seen in room A02C/A02C and the patient's care was started 2:24 AM.   Chief Complaint  Patient presents with  . Inguinal Hernia   The history is provided by the patient. No language interpreter was used.   HPI Comments: Adam Gonzalez is a 54 y.o. male, with a PMhx significant for hernias and PShx of hernia repair, who presents to the Emergency Department complaining of left-sided groin pain and mass attributable to a known left inguinal hernia. Pt reports he has had the left inguinal hernia for 7 months and is normally able to push the hernia back in but was unable to do so today. 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 Denies nausea, vomiting, fevers or chills. No bowel movement today but passing gas.  Past Medical History  Diagnosis Date  . Hernia    Past Surgical History  Procedure Laterality Date  . Hernia repair     No family history on file. Social History  Substance Use Topics  . Smoking status: Current Some Day Smoker    Types: Cigars, Cigarettes  . Smokeless tobacco: Never Used  . Alcohol Use: 0.6 oz/week    1 Cans of beer per week    Review of Systems  Constitutional: Negative for fever and chills.  Respiratory: Negative for shortness of breath.   Cardiovascular: Negative for chest pain.  Gastrointestinal: Positive for constipation. Negative for nausea, vomiting, abdominal pain and diarrhea.  Genitourinary: Positive for scrotal swelling. Negative for discharge, penile swelling and penile pain.  Musculoskeletal: Negative for back pain, neck pain and neck stiffness.  Skin: Negative for wound.  Neurological: Negative for dizziness, weakness, light-headedness, numbness and headaches.  All other systems reviewed and are negative.  Allergies  Review of patient's allergies indicates no  known allergies.  Home Medications   Prior to Admission medications   Medication Sig Start Date End Date Taking? Authorizing Provider  Azelastine HCl (ASTEPRO) 0.15 % SOLN Place 1 spray into the nose once. 12/17/13   Dorena Dew, FNP  HYDROcodone-acetaminophen (NORCO) 5-325 MG per tablet Take 1 tablet by mouth every 6 (six) hours as needed for moderate pain or severe pain. 03/11/15   Adam Rice, MD   BP 132/80 mmHg  Pulse 61  Temp(Src) 98.1 F (36.7 C) (Oral)  Resp 18  SpO2 99% Physical Exam  Constitutional: He is oriented to person, place, and time. He appears well-developed and well-nourished. No distress.  HENT:  Head: Normocephalic and atraumatic.  Mouth/Throat: Oropharynx is clear and moist.  Eyes: EOM are normal. Pupils are equal, round, and reactive to light.  Neck: Normal range of motion. Neck supple.  Cardiovascular: Normal rate and regular rhythm.   Pulmonary/Chest: Effort normal and breath sounds normal. No respiratory distress. He has no wheezes. He has no rales. He exhibits no tenderness.  Abdominal: Soft. Bowel sounds are normal. He exhibits no distension and no mass. There is no tenderness. There is no rebound and no guarding.  Genitourinary:  Large firm left inguinal hernia  Musculoskeletal: Normal range of motion. He exhibits no edema or tenderness.  Neurological: He is alert and oriented to person, place, and time.  Skin: Skin is warm and dry. No rash noted. No erythema.  Psychiatric: He has a normal mood and affect. His behavior is normal.  Nursing note and vitals reviewed.   ED Course  Procedures  DIAGNOSTIC STUDIES: Oxygen Saturation is 99% on RA, normal by my interpretation.    COORDINATION OF CARE: 2:26 AM Discussed treatment plan with pt. Pt acknowledges and agrees to plan.   Labs Review Labs Reviewed  CBC WITH DIFFERENTIAL/PLATELET  COMPREHENSIVE METABOLIC PANEL  I-STAT CG4 LACTIC ACID, ED    Imaging Review No results found. I have  personally reviewed and evaluated these images and lab results as part of my medical decision-making.   EKG Interpretation None      MDM   Final diagnoses:  Left inguinal hernia    I personally performed the services described in this documentation, which was scribed in my presence. The recorded information has been reviewed and is accurate.  Attempted reduction in the emergency department. Unable to reduce. Seen by Dr. Hulen Skains with successful reduction. Advises follow-up in his office.   Adam Rice, MD 03/11/15 [phone]

## 2015-03-11 NOTE — ED Notes (Signed)
Dr Hulen Skains in to see patient.

## 2015-04-01 ENCOUNTER — Other Ambulatory Visit: Payer: Self-pay | Admitting: General Surgery

## 2015-06-28 ENCOUNTER — Emergency Department (HOSPITAL_COMMUNITY)
Admission: EM | Admit: 2015-06-28 | Discharge: 2015-06-28 | Disposition: A | Discharge status: Home / Self Care | Payer: 59 | Attending: Emergency Medicine | Admitting: Emergency Medicine

## 2015-06-28 ENCOUNTER — Encounter (HOSPITAL_COMMUNITY): Payer: Self-pay

## 2015-06-28 ENCOUNTER — Emergency Department (HOSPITAL_COMMUNITY): Payer: 59

## 2015-06-28 DIAGNOSIS — K4090 Unilateral inguinal hernia, without obstruction or gangrene, not specified as recurrent: Secondary | ICD-10-CM | POA: Diagnosis not present

## 2015-06-28 DIAGNOSIS — F17210 Nicotine dependence, cigarettes, uncomplicated: Secondary | ICD-10-CM | POA: Insufficient documentation

## 2015-06-28 DIAGNOSIS — Z9889 Other specified postprocedural states: Secondary | ICD-10-CM | POA: Diagnosis not present

## 2015-06-28 LAB — BASIC METABOLIC PANEL
Anion gap: 9 (ref 5–15)
BUN: 15 mg/dL (ref 6–20)
CALCIUM: 9 mg/dL (ref 8.9–10.3)
CHLORIDE: 106 mmol/L (ref 101–111)
CO2: 24 mmol/L (ref 22–32)
CREATININE: 1.17 mg/dL (ref 0.61–1.24)
GFR calc non Af Amer: >60 mL/min (ref >60)
GLUCOSE: 98 mg/dL (ref 65–99)
Potassium: 3.9 mmol/L (ref 3.5–5.1)
Sodium: 139 mmol/L (ref 135–145)

## 2015-06-28 LAB — CBC WITH DIFFERENTIAL/PLATELET
BASOS PCT: 1 %
Basophils Absolute: 0.1 10*3/uL (ref 0.0–0.1)
Eosinophils Absolute: 0.4 10*3/uL (ref 0.0–0.7)
Eosinophils Relative: 8 %
HEMATOCRIT: 44.6 % (ref 39.0–52.0)
HEMOGLOBIN: 14.4 g/dL (ref 13.0–17.0)
LYMPHS ABS: 1.5 10*3/uL (ref 0.7–4.0)
Lymphocytes Relative: 28 %
MCH: 29.4 pg (ref 26.0–34.0)
MCHC: 32.3 g/dL (ref 30.0–36.0)
MCV: 91.2 fL (ref 78.0–100.0)
MONO ABS: 0.6 10*3/uL (ref 0.1–1.0)
MONOS PCT: 10 %
NEUTROS ABS: 2.8 10*3/uL (ref 1.7–7.7)
Neutrophils Relative %: 53 %
Platelets: 251 10*3/uL (ref 150–400)
RBC: 4.89 MIL/uL (ref 4.22–5.81)
RDW: 14 % (ref 11.5–15.5)
WBC: 5.4 10*3/uL (ref 4.0–10.5)

## 2015-06-28 LAB — I-STAT CG4 LACTIC ACID, ED: Lactic Acid, Venous: 0.74 mmol/L (ref 0.5–2.0)

## 2015-06-28 MED ORDER — HYDROMORPHONE HCL 1 MG/ML IJ SOLN
1.0000 mg | Freq: Once | INTRAMUSCULAR | Status: AC
  Administered 2015-06-28: 1 mg via INTRAVENOUS
  Filled 2015-06-28: qty 1

## 2015-06-28 MED ORDER — HYDROCODONE-ACETAMINOPHEN 5-325 MG PO TABS: 2.0000 | ORAL_TABLET | ORAL | Status: DC | PRN

## 2015-06-28 MED ORDER — SODIUM CHLORIDE 0.9 % IV BOLUS (SEPSIS)
500.0000 mL | Freq: Once | INTRAVENOUS | Status: AC
  Administered 2015-06-28: 500 mL via INTRAVENOUS

## 2015-06-28 MED ORDER — ONDANSETRON HCL 4 MG/2ML IJ SOLN
4.0000 mg | Freq: Once | INTRAMUSCULAR | Status: AC
  Administered 2015-06-28: 4 mg via INTRAVENOUS
  Filled 2015-06-28: qty 2

## 2015-06-28 NOTE — ED Provider Notes (Signed)
CSN: [id number]   Arrival date & time 06/28/15 0038  History  By signing my name below, I, Altamease Oiler, attest that this documentation has been prepared under the direction and in the presence of Orpah Greek, MD. Electronically Signed: Altamease Oiler, ED Scribe. 06/28/2015. 3:37 AM.  Chief Complaint  Patient presents with  . Inguinal Hernia    HPI The history is provided by the patient. No language interpreter was used.   Adam Gonzalez is a 54 y.o. male who presents to the Emergency Department complaining of worsening, 10/10 in severity, burning pain at the site of a left inguinal hernia with onset yesterday. The constant pain is exacerbated by walking. He usually wraps the left hip and thigh with an ace bandage with some pain relief but that was not effective tonight. Associated symptoms include increased swelling at the site of the hernia with onset yesterday.   Past Medical History  Diagnosis Date  . Hernia     Past Surgical History  Procedure Laterality Date  . Hernia repair      No family history on file.  Social History  Substance Use Topics  . Smoking status: Current Some Day Smoker    Types: Cigars, Cigarettes  . Smokeless tobacco: Never Used  . Alcohol Use: 0.6 oz/week    1 Cans of beer per week     Review of Systems  Genitourinary:       Increased pain and swelling at left inguinal hernia  All other systems reviewed and are negative.  Home Medications   Prior to Admission medications   Medication Sig Start Date End Date Taking? Authorizing Provider  Azelastine HCl (ASTEPRO) 0.15 % SOLN Place 1 spray into the nose once. 12/17/13   Dorena Dew, FNP  HYDROcodone-acetaminophen (NORCO/VICODIN) 5-325 MG tablet Take 2 tablets by mouth every 4 (four) hours as needed for moderate pain. 06/28/15   Orpah Greek, MD    Allergies  Review of patient's allergies indicates no known allergies.  Triage Vitals: BP 123/83 mmHg  Pulse 67   Temp(Src) 98.5 F (36.9 C) (Oral)  Resp 18  Ht 5\' 9"  (1.753 m)  Wt 150 lb (68.04 kg)  BMI 22.14 kg/m2  SpO2 97%  Physical Exam  Constitutional: He is oriented to person, place, and time. He appears well-developed and well-nourished. No distress.  HENT:  Head: Normocephalic and atraumatic.  Right Ear: Hearing normal.  Left Ear: Hearing normal.  Nose: Nose normal.  Mouth/Throat: Oropharynx is clear and moist and mucous membranes are normal.  Eyes: Conjunctivae and EOM are normal. Pupils are equal, round, and reactive to light.  Neck: Normal range of motion. Neck supple.  Cardiovascular: Regular rhythm, S1 normal and S2 normal.  Exam reveals no gallop and no friction rub.   No murmur heard. Pulmonary/Chest: Effort normal and breath sounds normal. No respiratory distress. He exhibits no tenderness.  Abdominal: Soft. Normal appearance and bowel sounds are normal. There is no hepatosplenomegaly. There is no tenderness. There is no rebound, no guarding, no tenderness at McBurney's point and negative Murphy's sign. A hernia is present. Hernia confirmed positive in the left inguinal area.  Genitourinary:  Large left inguinal hernia  Musculoskeletal: Normal range of motion.  Neurological: He is alert and oriented to person, place, and time. He has normal strength. No cranial nerve deficit or sensory deficit. Coordination normal. GCS eye subscore is 4. GCS verbal subscore is 5. GCS motor subscore is 6.  Skin: Skin is warm, dry and  intact. No rash noted. No cyanosis.  Psychiatric: He has a normal mood and affect. His speech is normal and behavior is normal. Thought content normal.  Nursing note and vitals reviewed.   ED Course  Procedures   DIAGNOSTIC STUDIES: Oxygen Saturation is 97% on RA, normal by my interpretation.    COORDINATION OF CARE: 1:04 AM Discussed treatment plan which includes lab work, XR of the abdomen and chest, and pain management with pt at bedside and pt agreed to  plan.  Labs Reviewed  CBC WITH DIFFERENTIAL/PLATELET  BASIC METABOLIC PANEL  I-STAT CG4 LACTIC ACID, ED    Imaging Review Dg Abd Acute W/chest  06/28/2015  CLINICAL DATA:  Left lower quadrant abdominal pain, acute onset. Difficulty walking. Initial encounter. EXAM: DG ABDOMEN ACUTE W/ 1V CHEST COMPARISON:  None. FINDINGS: The lungs are well-aerated. Right midlung atelectasis is noted, with underlying nonspecific pleural thickening. Mild scarring is noted at the lung apices. There is no evidence of pleural effusion or pneumothorax. The cardiomediastinal silhouette is within normal limits. The visualized bowel gas pattern is nonspecific. Air-fluid levels are noted within the small bowel, without evidence of small bowel dilatation to suggest obstruction. Scattered stool and air are seen within the colon. No free intra-abdominal air is identified on the provided upright view. No acute osseous abnormalities are seen; the sacroiliac joints are unremarkable in appearance. IMPRESSION: 1. Air-fluid levels noted within the small bowel, without evidence for bowel obstruction. This may reflect mild small bowel dysmotility. No free intra-abdominal air seen. 2. Right midlung atelectasis noted, with underlying nonspecific pleural thickening. Mild scarring at the lung apices. Electronically Signed   By: Garald Balding M.D.   On: 06/28/2015 02:03    I personally reviewed and evaluated these images and lab results as a part of my medical decision-making.    MDM   Final diagnoses:  Unilateral inguinal hernia without obstruction or gangrene, recurrence not specified    Presents with large left inguinal hernia. Patient reports that the hernia has been present for some time. He reports that the size of the hernia waxes and wanes. He had increased pain tonight. Patient was administered analgesia and placed in reverse Trendelenburg. I was able to completely reduce the hernia, although it did recur immediately after  reduction. X-ray does not show any evidence of obstruction. Labs are normal. Patient was divided analgesia and will follow-up with general surgery in the office this week.  I personally performed the services described in this documentation, which was scribed in my presence. The recorded information has been reviewed and is accurate.     Orpah Greek, MD 06/28/15 [phone]

## 2015-06-28 NOTE — ED Notes (Signed)
Physician at the bedside.  

## 2015-06-28 NOTE — Discharge Instructions (Signed)

## 2015-06-28 NOTE — ED Notes (Signed)
Pt has had a left inguinal hernia for a while and usually tries to wrap his thigh/hip with an ace wrap but it was burning and hurting tonight so bad.

## 2015-07-22 ENCOUNTER — Encounter (HOSPITAL_COMMUNITY): Payer: Self-pay | Admitting: *Deleted

## 2015-07-22 ENCOUNTER — Emergency Department (HOSPITAL_COMMUNITY)
Admission: EM | Admit: 2015-07-22 | Discharge: 2015-07-22 | Disposition: A | Discharge status: Home / Self Care | Payer: 59 | Attending: Emergency Medicine | Admitting: Emergency Medicine

## 2015-07-22 DIAGNOSIS — F17210 Nicotine dependence, cigarettes, uncomplicated: Secondary | ICD-10-CM | POA: Insufficient documentation

## 2015-07-22 DIAGNOSIS — K4090 Unilateral inguinal hernia, without obstruction or gangrene, not specified as recurrent: Secondary | ICD-10-CM | POA: Insufficient documentation

## 2015-07-22 MED ORDER — HYDROCODONE-ACETAMINOPHEN 5-325 MG PO TABS: 1.0000 | ORAL_TABLET | ORAL | Status: DC | PRN

## 2015-07-22 MED ORDER — HYDROCODONE-ACETAMINOPHEN 5-325 MG PO TABS
1.0000 | ORAL_TABLET | Freq: Once | ORAL | Status: AC
  Administered 2015-07-22: 1 via ORAL
  Filled 2015-07-22: qty 1

## 2015-07-22 NOTE — Discharge Instructions (Signed)
As discussed, with your ongoing discomfort from the inguinal hernia is very important that you follow-up with our surgical colleagues for definitive treatment.  You may also elect to discuss your situation with our financial assistance team.  Return here for concerning changes in your condition.   Inguinal Hernia, Adult , Care After Refer to this sheet in the next few weeks. These discharge instructions provide you with general information on caring for yourself after you leave the hospital. Your caregiver may also give you specific instructions. Your treatment has been planned according to the most current medical practices available, but unavoidable complications sometimes occur. If you have any problems or questions after discharge, please call your caregiver. HOME CARE INSTRUCTIONS  Put ice on the operative site.  Put ice in a plastic bag.  Place a towel between your skin and the bag.  Leave the ice on for 15-20 minutes at a time, 03-04 times a day while awake.  Change bandages (dressings) as directed.  Keep the wound dry and clean. The wound may be washed gently with soap and water. Gently blot or dab the wound dry. It is okay to take showers 24 to 48 hours after surgery. Do not take baths, use swimming pools, or use hot tubs for 10 days, or as directed by your caregiver.  Only take over-the-counter or prescription medicines for pain, discomfort, or fever as directed by your caregiver.  Continue your normal diet as directed.  Do not lift anything more than 10 pounds or play contact sports for 3 weeks, or as directed. SEEK MEDICAL CARE IF:  There is redness, swelling, or increasing pain in the wound.  There is fluid (pus) coming from the wound.  There is drainage from a wound lasting longer than 1 day.  You have an oral temperature above 102 F (38.9 C).  You notice a bad smell coming from the wound or dressing.  The wound breaks open after the stitches (sutures) have been  removed.  You notice increasing pain in the shoulders (shoulder strap areas).  You develop dizzy episodes or fainting while standing.  You feel sick to your stomach (nauseous) or throw up (vomit). SEEK IMMEDIATE MEDICAL CARE IF:  You develop a rash.  You have difficulty breathing.  You develop a reaction or have side effects to medicines you were given. MAKE SURE YOU:   Understand these instructions.  Will watch your condition.  Will get help right away if you are not doing well or get worse.   This information is not intended to replace advice given to you by your health care provider. Make sure you discuss any questions you have with your health care provider.   Document Released: 08/03/2006 Document Revised: 07/24/2014 Document Reviewed: 01/04/2015 Elsevier Interactive Patient Education [employer].

## 2015-07-22 NOTE — ED Notes (Signed)
Pt ambulating independently w/ steady gait on d/c in no acute distress, A&Ox4. D/c instructions reviewed w/ pt and family - pt and family deny any further questions or concerns at present. Rx given x1  

## 2015-07-22 NOTE — ED Provider Notes (Signed)
CSN: [id number]     Arrival date & time 07/22/15  1947 History   By signing my name below, I, Forrestine Him, attest that this documentation has been prepared under the direction and in the presence of Carmin Muskrat, MD.  Electronically Signed: Forrestine Him, ED Scribe. 07/22/2015. 11:20 PM.   Chief Complaint  Patient presents with  . Hernia   HPI  HPI Comments: Adam Gonzalez is a 55 y.o. male without any pertinent past medical history who presents to the Emergency Department here for an ongoing, known, painful inguinal hernia that has been chronic for some time but worsened today. Pt states he was removing leaves from the pool and felt a "strain". Pt denies any aggravating or alleviating factors at this time. Pt also reports intermittent nausea and chills. No OTC medications attempted prior to arrival. However, pt has attempted to use an ACE bandage for support. No recent fever, vomiting, or inability to urinate. Adam Gonzalez was last evaluated for this concern last month on 06/28/15. At that time, an X-Ray was performed which did not show any evidence of obstruction. Pt was encouraged to follow up with general surgery. He is currently in the process of scheduling an appointment.  PCP: No PCP Per Patient    Past Medical History  Diagnosis Date  . Hernia    Past Surgical History  Procedure Laterality Date  . Hernia repair     No family history on file. Social History  Substance Use Topics  . Smoking status: Current Some Day Smoker    Types: Cigars, Cigarettes  . Smokeless tobacco: Never Used  . Alcohol Use: 0.6 oz/week    1 Cans of beer per week    Review of Systems  Constitutional:       Per HPI, otherwise negative  HENT:       Per HPI, otherwise negative  Respiratory:       Per HPI, otherwise negative  Cardiovascular:       Per HPI, otherwise negative  Gastrointestinal: Negative for vomiting and abdominal pain.  Endocrine:       Negative aside from HPI  Genitourinary:  Positive for scrotal swelling. Negative for dysuria, frequency, hematuria, decreased urine volume, discharge, penile swelling, difficulty urinating and penile pain.  Musculoskeletal:       Per HPI, otherwise negative  Skin: Negative.   Neurological: Negative for syncope.     A complete 10 system review of systems was obtained and all systems are negative except as noted in the HPI and PMH.    Allergies  Review of patient's allergies indicates no known allergies.  Home Medications   Prior to Admission medications   Medication Sig Start Date End Date Taking? Authorizing Provider  HYDROcodone-acetaminophen (NORCO/VICODIN) 5-325 MG tablet Take 2 tablets by mouth every 4 (four) hours as needed for moderate pain. 06/28/15  Yes Orpah Greek, MD  ibuprofen (ADVIL,MOTRIN) 200 MG tablet Take 600 mg by mouth every 6 (six) hours as needed.   Yes Historical Provider, MD   Triage Vitals: BP 137/93 mmHg  Pulse 61  Temp(Src) 98.2 F (36.8 C) (Oral)  Resp 16  SpO2 99%   Physical Exam  Constitutional: He is oriented to person, place, and time. He appears well-developed. No distress.  HENT:  Head: Normocephalic and atraumatic.  Eyes: Conjunctivae and EOM are normal.  Cardiovascular: Normal rate and regular rhythm.   Pulmonary/Chest: Effort normal. No stridor. No respiratory distress.  Abdominal: He exhibits no distension. There is no tenderness.  A hernia is present. Hernia confirmed positive in the left inguinal area. Hernia confirmed negative in the right inguinal area.  Genitourinary: Penis normal. Left testis shows mass and tenderness.  Musculoskeletal: He exhibits no edema.  Neurological: He is alert and oriented to person, place, and time.  Skin: Skin is warm and dry.  Psychiatric: He has a normal mood and affect.  Nursing note and vitals reviewed.   ED Course  Procedures (including critical care time)  DIAGNOSTIC STUDIES: Oxygen Saturation is 99% on RA, Normal by my  interpretation.    COORDINATION OF CARE: 11:20 PM-Discussed treatment plan with pt at bedside and pt agreed to plan.     Chart review notable for prior visits for hernia pain.   MDM   I personally performed the services described in this documentation, which was scribed in my presence. The recorded information has been reviewed and is accurate.    Patient presents with concern of ongoing left inguinal pain. Patient has a normal hernia on exam, but no evidence for incarceration, necrosis. Patient is hemodynamically stable, and with his otherwise reassuring physical exam, there is no emergent need for surgical repair. We discussed additional imaging with CT scan to evaluate for changes compared to last month versus outpatient follow-up with surgery. Patient will follow up with surgery as an outpatient. In addition, patient was provided additional resources for financial assistance as this has been difficult in the past. Patient discharged in stable condition with analgesia, work excuse.   Carmin Muskrat, MD 07/22/15 [phone]

## 2015-07-22 NOTE — ED Notes (Signed)
Pt c/o worsening hernia pain. Today he was getting leaves out of the pool and strained. States he saw a Psychologist, sport and exercise but cannot afford it.

## 2015-07-22 NOTE — ED Notes (Signed)
Dr.Lockwood at bedside  

## 2015-08-01 ENCOUNTER — Inpatient Hospital Stay (HOSPITAL_COMMUNITY)
Admission: EM | Admit: 2015-08-01 | Discharge: 2015-08-03 | DRG: 352 | Disposition: A | Discharge status: Home / Self Care | Payer: 59 | Attending: General Surgery | Admitting: General Surgery

## 2015-08-01 ENCOUNTER — Emergency Department (HOSPITAL_COMMUNITY): Payer: 59

## 2015-08-01 ENCOUNTER — Encounter (HOSPITAL_COMMUNITY): Payer: Self-pay | Admitting: Emergency Medicine

## 2015-08-01 DIAGNOSIS — K4030 Unilateral inguinal hernia, with obstruction, without gangrene, not specified as recurrent: Principal | ICD-10-CM | POA: Diagnosis present

## 2015-08-01 DIAGNOSIS — Z7982 Long term (current) use of aspirin: Secondary | ICD-10-CM

## 2015-08-01 DIAGNOSIS — R109 Unspecified abdominal pain: Secondary | ICD-10-CM | POA: Diagnosis not present

## 2015-08-01 DIAGNOSIS — F17210 Nicotine dependence, cigarettes, uncomplicated: Secondary | ICD-10-CM | POA: Diagnosis present

## 2015-08-01 DIAGNOSIS — F17290 Nicotine dependence, other tobacco product, uncomplicated: Secondary | ICD-10-CM | POA: Diagnosis present

## 2015-08-01 DIAGNOSIS — R319 Hematuria, unspecified: Secondary | ICD-10-CM | POA: Diagnosis present

## 2015-08-01 HISTORY — DX: Other specified health status: Z78.9

## 2015-08-01 LAB — CBC
HEMATOCRIT: 43.5 % (ref 39.0–52.0)
Hemoglobin: 14.2 g/dL (ref 13.0–17.0)
MCH: 29.1 pg (ref 26.0–34.0)
MCHC: 32.6 g/dL (ref 30.0–36.0)
MCV: 89.1 fL (ref 78.0–100.0)
Platelets: 259 10*3/uL (ref 150–400)
RBC: 4.88 MIL/uL (ref 4.22–5.81)
RDW: 13 % (ref 11.5–15.5)
WBC: 5.6 10*3/uL (ref 4.0–10.5)

## 2015-08-01 LAB — COMPREHENSIVE METABOLIC PANEL
ALBUMIN: 3.6 g/dL (ref 3.5–5.0)
ALT: 15 U/L — ABNORMAL LOW (ref 17–63)
AST: 23 U/L (ref 15–41)
Alkaline Phosphatase: 57 U/L (ref 38–126)
Anion gap: 12 (ref 5–15)
BUN: 12 mg/dL (ref 6–20)
CHLORIDE: 102 mmol/L (ref 101–111)
CO2: 25 mmol/L (ref 22–32)
Calcium: 9.1 mg/dL (ref 8.9–10.3)
Creatinine, Ser: 1 mg/dL (ref 0.61–1.24)
GFR calc Af Amer: >60 mL/min (ref >60)
GFR calc non Af Amer: >60 mL/min (ref >60)
GLUCOSE: 96 mg/dL (ref 65–99)
POTASSIUM: 3.8 mmol/L (ref 3.5–5.1)
Sodium: 139 mmol/L (ref 135–145)
Total Bilirubin: 0.2 mg/dL — ABNORMAL LOW (ref 0.3–1.2)
Total Protein: 6.5 g/dL (ref 6.5–8.1)

## 2015-08-01 LAB — URINALYSIS, ROUTINE W REFLEX MICROSCOPIC
Glucose, UA: NEGATIVE mg/dL
Ketones, ur: 15 mg/dL — AB
Nitrite: NEGATIVE
Protein, ur: 100 mg/dL — AB
SPECIFIC GRAVITY, URINE: 1.013 (ref 1.005–1.030)
pH: 6 (ref 5.0–8.0)

## 2015-08-01 LAB — LIPASE, BLOOD: LIPASE: 73 U/L — AB (ref 11–51)

## 2015-08-01 LAB — URINE MICROSCOPIC-ADD ON: BACTERIA UA: NONE SEEN

## 2015-08-01 MED ORDER — HYDROMORPHONE HCL 1 MG/ML IJ SOLN
1.0000 mg | Freq: Once | INTRAMUSCULAR | Status: AC
  Administered 2015-08-01: 1 mg via INTRAVENOUS
  Filled 2015-08-01: qty 1

## 2015-08-01 MED ORDER — IOHEXOL 300 MG/ML  SOLN
25.0000 mL | Freq: Once | INTRAMUSCULAR | Status: DC | PRN
  Administered 2015-08-01: 25 mL via ORAL
  Filled 2015-08-01: qty 30

## 2015-08-01 MED ORDER — IOHEXOL 300 MG/ML  SOLN
100.0000 mL | Freq: Once | INTRAMUSCULAR | Status: AC | PRN
  Administered 2015-08-01: 100 mL via INTRAVENOUS

## 2015-08-01 NOTE — ED Notes (Signed)
Family at bedside. 

## 2015-08-01 NOTE — ED Notes (Signed)
Reports L inguinal hernia x 4-5 months.  States he is unable to work because hernia keeps "falling down" despite him using an ace bandage to keep it in place.  Reports generalized abd pain, nausea, vomited x 2,  and hematuria that started today.  Reports underwear was full of blood.

## 2015-08-02 ENCOUNTER — Emergency Department (HOSPITAL_COMMUNITY): Payer: 59 | Admitting: Certified Registered Nurse Anesthetist

## 2015-08-02 ENCOUNTER — Encounter (HOSPITAL_COMMUNITY): Payer: Self-pay | Admitting: General Practice

## 2015-08-02 ENCOUNTER — Encounter (HOSPITAL_COMMUNITY): Admission: EM | Disposition: A | Discharge status: Home / Self Care | Payer: Self-pay | Source: Home / Self Care

## 2015-08-02 DIAGNOSIS — R109 Unspecified abdominal pain: Secondary | ICD-10-CM | POA: Diagnosis present

## 2015-08-02 DIAGNOSIS — R319 Hematuria, unspecified: Secondary | ICD-10-CM | POA: Diagnosis present

## 2015-08-02 DIAGNOSIS — K4030 Unilateral inguinal hernia, with obstruction, without gangrene, not specified as recurrent: Secondary | ICD-10-CM | POA: Diagnosis present

## 2015-08-02 DIAGNOSIS — Z7982 Long term (current) use of aspirin: Secondary | ICD-10-CM | POA: Diagnosis not present

## 2015-08-02 DIAGNOSIS — F17210 Nicotine dependence, cigarettes, uncomplicated: Secondary | ICD-10-CM | POA: Diagnosis present

## 2015-08-02 DIAGNOSIS — F17290 Nicotine dependence, other tobacco product, uncomplicated: Secondary | ICD-10-CM | POA: Diagnosis present

## 2015-08-02 HISTORY — PX: INGUINAL HERNIA REPAIR: SUR1180

## 2015-08-02 HISTORY — PX: INGUINAL HERNIA REPAIR: SHX194

## 2015-08-02 LAB — CBC
HEMATOCRIT: 43.2 % (ref 39.0–52.0)
Hemoglobin: 14.4 g/dL (ref 13.0–17.0)
MCH: 29.8 pg (ref 26.0–34.0)
MCHC: 33.3 g/dL (ref 30.0–36.0)
MCV: 89.3 fL (ref 78.0–100.0)
PLATELETS: 237 10*3/uL (ref 150–400)
RBC: 4.84 MIL/uL (ref 4.22–5.81)
RDW: 13.1 % (ref 11.5–15.5)
WBC: 11.3 10*3/uL — AB (ref 4.0–10.5)

## 2015-08-02 LAB — CREATININE, SERUM
CREATININE: 1 mg/dL (ref 0.61–1.24)
GFR calc Af Amer: >60 mL/min (ref >60)
GFR calc non Af Amer: >60 mL/min (ref >60)

## 2015-08-02 SURGERY — REPAIR, HERNIA, INGUINAL, ADULT
Anesthesia: General | Site: Groin | Laterality: Left

## 2015-08-02 MED ORDER — ONDANSETRON HCL 4 MG/2ML IJ SOLN
INTRAMUSCULAR | Status: DC | PRN
  Administered 2015-08-02: 4 mg via INTRAVENOUS

## 2015-08-02 MED ORDER — ONDANSETRON HCL 4 MG/2ML IJ SOLN
INTRAMUSCULAR | Status: AC
  Filled 2015-08-02: qty 2

## 2015-08-02 MED ORDER — OXYCODONE HCL 5 MG PO TABS
5.0000 mg | ORAL_TABLET | ORAL | Status: DC | PRN
  Administered 2015-08-02 – 2015-08-03 (×5): 10 mg via ORAL
  Filled 2015-08-02 (×7): qty 2

## 2015-08-02 MED ORDER — KETOROLAC TROMETHAMINE 15 MG/ML IJ SOLN: 15.0000 mg | Freq: Four times a day (QID) | INTRAMUSCULAR | Status: DC | PRN

## 2015-08-02 MED ORDER — ONDANSETRON HCL 4 MG/2ML IJ SOLN: 4.0000 mg | Freq: Four times a day (QID) | INTRAMUSCULAR | Status: DC | PRN

## 2015-08-02 MED ORDER — HYDROMORPHONE HCL 1 MG/ML IJ SOLN
0.2500 mg | INTRAMUSCULAR | Status: DC | PRN
  Administered 2015-08-02 (×3): 0.5 mg via INTRAVENOUS

## 2015-08-02 MED ORDER — ENOXAPARIN SODIUM 40 MG/0.4ML ~~LOC~~ SOLN
40.0000 mg | SUBCUTANEOUS | Status: DC
Start: 2015-08-03 — End: 2015-08-03
  Administered 2015-08-03: 40 mg via SUBCUTANEOUS
  Filled 2015-08-02: qty 0.4

## 2015-08-02 MED ORDER — SUCCINYLCHOLINE CHLORIDE 20 MG/ML IJ SOLN
INTRAMUSCULAR | Status: AC
  Filled 2015-08-02: qty 1

## 2015-08-02 MED ORDER — PHENYLEPHRINE 40 MCG/ML (10ML) SYRINGE FOR IV PUSH (FOR BLOOD PRESSURE SUPPORT)
PREFILLED_SYRINGE | INTRAVENOUS | Status: AC
  Filled 2015-08-02: qty 10

## 2015-08-02 MED ORDER — ONDANSETRON HCL 4 MG/2ML IJ SOLN: 4.0000 mg | Freq: Once | INTRAMUSCULAR | Status: DC | PRN

## 2015-08-02 MED ORDER — FENTANYL CITRATE (PF) 250 MCG/5ML IJ SOLN
INTRAMUSCULAR | Status: AC
  Filled 2015-08-02: qty 5

## 2015-08-02 MED ORDER — SUGAMMADEX SODIUM 200 MG/2ML IV SOLN
INTRAVENOUS | Status: AC
  Filled 2015-08-02: qty 2

## 2015-08-02 MED ORDER — SENNOSIDES-DOCUSATE SODIUM 8.6-50 MG PO TABS: 1.0000 | ORAL_TABLET | Freq: Every evening | ORAL | Status: DC | PRN

## 2015-08-02 MED ORDER — CEFAZOLIN SODIUM-DEXTROSE 2-3 GM-% IV SOLR
2.0000 g | INTRAVENOUS | Status: AC
  Administered 2015-08-02: 2 g via INTRAVENOUS
  Filled 2015-08-02: qty 50

## 2015-08-02 MED ORDER — MORPHINE SULFATE (PF) 2 MG/ML IV SOLN
2.0000 mg | INTRAVENOUS | Status: DC | PRN
  Administered 2015-08-02: 2 mg via INTRAVENOUS
  Filled 2015-08-02: qty 1

## 2015-08-02 MED ORDER — KCL IN DEXTROSE-NACL 20-5-0.45 MEQ/L-%-% IV SOLN
INTRAVENOUS | Status: DC
  Administered 2015-08-02 (×2): via INTRAVENOUS
  Filled 2015-08-02 (×4): qty 1000

## 2015-08-02 MED ORDER — PROPOFOL 10 MG/ML IV BOLUS
INTRAVENOUS | Status: DC | PRN
  Administered 2015-08-02: 150 mg via INTRAVENOUS

## 2015-08-02 MED ORDER — SUCCINYLCHOLINE CHLORIDE 20 MG/ML IJ SOLN
INTRAMUSCULAR | Status: DC | PRN
  Administered 2015-08-02: 120 mg via INTRAVENOUS

## 2015-08-02 MED ORDER — ROCURONIUM BROMIDE 50 MG/5ML IV SOLN
INTRAVENOUS | Status: AC
  Filled 2015-08-02: qty 1

## 2015-08-02 MED ORDER — MIDAZOLAM HCL 2 MG/2ML IJ SOLN
INTRAMUSCULAR | Status: AC
  Filled 2015-08-02: qty 2

## 2015-08-02 MED ORDER — LIDOCAINE HCL (CARDIAC) 20 MG/ML IV SOLN
INTRAVENOUS | Status: DC | PRN
  Administered 2015-08-02: 100 mg via INTRAVENOUS

## 2015-08-02 MED ORDER — DEXAMETHASONE SODIUM PHOSPHATE 4 MG/ML IJ SOLN
INTRAMUSCULAR | Status: DC | PRN
  Administered 2015-08-02: 4 mg via INTRAVENOUS

## 2015-08-02 MED ORDER — FENTANYL CITRATE (PF) 100 MCG/2ML IJ SOLN
INTRAMUSCULAR | Status: DC | PRN
  Administered 2015-08-02 (×2): 50 ug via INTRAVENOUS

## 2015-08-02 MED ORDER — MEPERIDINE HCL 25 MG/ML IJ SOLN: 6.2500 mg | INTRAMUSCULAR | Status: DC | PRN

## 2015-08-02 MED ORDER — DEXAMETHASONE SODIUM PHOSPHATE 4 MG/ML IJ SOLN
INTRAMUSCULAR | Status: AC
  Filled 2015-08-02: qty 1

## 2015-08-02 MED ORDER — SODIUM CHLORIDE 0.9 % IV SOLN
INTRAVENOUS | Status: DC | PRN
  Administered 2015-08-02: 60 mL

## 2015-08-02 MED ORDER — SUGAMMADEX SODIUM 200 MG/2ML IV SOLN
INTRAVENOUS | Status: DC | PRN
  Administered 2015-08-02: 150 mg via INTRAVENOUS

## 2015-08-02 MED ORDER — BUPIVACAINE LIPOSOME 1.3 % IJ SUSP
20.0000 mL | INTRAMUSCULAR | Status: DC
  Filled 2015-08-02: qty 20

## 2015-08-02 MED ORDER — 0.9 % SODIUM CHLORIDE (POUR BTL) OPTIME
TOPICAL | Status: DC | PRN
  Administered 2015-08-02: 1000 mL

## 2015-08-02 MED ORDER — ONDANSETRON 4 MG PO TBDP
4.0000 mg | ORAL_TABLET | Freq: Four times a day (QID) | ORAL | Status: DC | PRN
  Filled 2015-08-02: qty 1

## 2015-08-02 MED ORDER — HYDROMORPHONE HCL 1 MG/ML IJ SOLN
INTRAMUSCULAR | Status: AC
  Filled 2015-08-02: qty 1

## 2015-08-02 MED ORDER — LACTATED RINGERS IV SOLN
INTRAVENOUS | Status: DC | PRN
  Administered 2015-08-02 (×2): via INTRAVENOUS

## 2015-08-02 MED ORDER — PHENYLEPHRINE HCL 10 MG/ML IJ SOLN
INTRAMUSCULAR | Status: DC | PRN
  Administered 2015-08-02: 80 ug via INTRAVENOUS
  Administered 2015-08-02 (×3): 40 ug via INTRAVENOUS
  Administered 2015-08-02: 80 ug via INTRAVENOUS

## 2015-08-02 MED ORDER — HYDROMORPHONE HCL 1 MG/ML IJ SOLN
1.0000 mg | Freq: Once | INTRAMUSCULAR | Status: AC
  Administered 2015-08-02: 1 mg via INTRAVENOUS
  Filled 2015-08-02: qty 1

## 2015-08-02 MED ORDER — HYDROMORPHONE HCL 1 MG/ML IJ SOLN
INTRAMUSCULAR | Status: AC
  Administered 2015-08-02: 0.5 mg via INTRAVENOUS
  Filled 2015-08-02: qty 1

## 2015-08-02 MED ORDER — ARTIFICIAL TEARS OP OINT
TOPICAL_OINTMENT | OPHTHALMIC | Status: AC
  Filled 2015-08-02: qty 3.5

## 2015-08-02 MED ORDER — ROCURONIUM BROMIDE 100 MG/10ML IV SOLN
INTRAVENOUS | Status: DC | PRN
  Administered 2015-08-02 (×2): 10 mg via INTRAVENOUS
  Administered 2015-08-02: 25 mg via INTRAVENOUS
  Administered 2015-08-02: 5 mg via INTRAVENOUS

## 2015-08-02 MED ORDER — MIDAZOLAM HCL 5 MG/5ML IJ SOLN
INTRAMUSCULAR | Status: DC | PRN
  Administered 2015-08-02: 1 mg via INTRAVENOUS

## 2015-08-02 MED ORDER — PROPOFOL 10 MG/ML IV BOLUS
INTRAVENOUS | Status: AC
  Filled 2015-08-02: qty 20

## 2015-08-02 MED ORDER — KETOROLAC TROMETHAMINE 15 MG/ML IJ SOLN
15.0000 mg | Freq: Four times a day (QID) | INTRAMUSCULAR | Status: AC
  Administered 2015-08-02: 15 mg via INTRAVENOUS
  Filled 2015-08-02: qty 1

## 2015-08-02 SURGICAL SUPPLY — 42 items
BENZOIN TINCTURE PRP APPL 2/3 (GAUZE/BANDAGES/DRESSINGS) ×3 IMPLANT
BLADE SURG ROTATE 9660 (MISCELLANEOUS) ×3 IMPLANT
CANISTER SUCTION 2500CC (MISCELLANEOUS) ×3 IMPLANT
CELLS DAT CNTRL 66122 CELL SVR (MISCELLANEOUS) IMPLANT
CHLORAPREP W/TINT 26ML (MISCELLANEOUS) ×3 IMPLANT
CLOSURE WOUND 1/2 X4 (GAUZE/BANDAGES/DRESSINGS) ×1
COVER SURGICAL LIGHT HANDLE (MISCELLANEOUS) ×3 IMPLANT
DRAIN PENROSE 1/4X12 LTX STRL (WOUND CARE) ×3 IMPLANT
DRAPE LAPAROSCOPIC ABDOMINAL (DRAPES) ×3 IMPLANT
DRSG COVADERM 4X6 (GAUZE/BANDAGES/DRESSINGS) ×3 IMPLANT
ELECT CAUTERY BLADE 6.4 (BLADE) ×3 IMPLANT
ELECT REM PT RETURN 9FT ADLT (ELECTROSURGICAL) ×3
ELECTRODE REM PT RTRN 9FT ADLT (ELECTROSURGICAL) ×1 IMPLANT
GLOVE BIOGEL PI IND STRL 7.0 (GLOVE) ×1 IMPLANT
GLOVE BIOGEL PI INDICATOR 7.0 (GLOVE) ×2
GLOVE SURG SS PI 7.0 STRL IVOR (GLOVE) ×3 IMPLANT
GOWN STRL REUS W/ TWL LRG LVL3 (GOWN DISPOSABLE) ×2 IMPLANT
GOWN STRL REUS W/TWL LRG LVL3 (GOWN DISPOSABLE) ×4
KIT BASIN OR (CUSTOM PROCEDURE TRAY) ×3 IMPLANT
KIT ROOM TURNOVER OR (KITS) ×3 IMPLANT
MARKER SKIN DUAL TIP RULER LAB (MISCELLANEOUS) ×3 IMPLANT
MESH BARD SOFT 3X6IN (Mesh General) ×3 IMPLANT
NEEDLE HYPO 25GX1X1/2 BEV (NEEDLE) ×3 IMPLANT
NS IRRIG 1000ML POUR BTL (IV SOLUTION) ×3 IMPLANT
PACK GENERAL/GYN (CUSTOM PROCEDURE TRAY) ×3 IMPLANT
PAD ARMBOARD 7.5X6 YLW CONV (MISCELLANEOUS) ×3 IMPLANT
RTRCTR WOUND ALEXIS 18CM MED (MISCELLANEOUS)
RTRCTR WOUND ALEXIS 18CM SML (INSTRUMENTS)
SAVER CELL AAL HAEMONETICS (INSTRUMENTS) IMPLANT
SPONGE GAUZE 4X4 12PLY STER LF (GAUZE/BANDAGES/DRESSINGS) ×3 IMPLANT
STRIP CLOSURE SKIN 1/2X4 (GAUZE/BANDAGES/DRESSINGS) ×2 IMPLANT
SUT MNCRL AB 4-0 PS2 18 (SUTURE) ×3 IMPLANT
SUT PROLENE 0 CT 1 CR/8 (SUTURE) ×6 IMPLANT
SUT SILK 3 0 SH CR/8 (SUTURE) ×3 IMPLANT
SUT VIC AB 2-0 SH 27 (SUTURE) ×2
SUT VIC AB 2-0 SH 27X BRD (SUTURE) ×1 IMPLANT
SUT VIC AB 3-0 SH 18 (SUTURE) IMPLANT
SUT VIC AB 3-0 SH 27 (SUTURE) ×2
SUT VIC AB 3-0 SH 27XBRD (SUTURE) ×1 IMPLANT
SYR CONTROL 10ML LL (SYRINGE) ×3 IMPLANT
TOWEL OR 17X24 6PK STRL BLUE (TOWEL DISPOSABLE) ×3 IMPLANT
TOWEL OR 17X26 10 PK STRL BLUE (TOWEL DISPOSABLE) ×3 IMPLANT

## 2015-08-02 NOTE — Op Note (Signed)
Preoperative diagnosis: incarcerated inguinal hernia  Postoperative diagnosis: incarcerated left inguinal hernia   Procedure: open left incarcerated inguinal repair with mesh  Surgeon: Gurney Maxin, M.D.  Asst: none  Anesthesia: General  Indications for procedure: Adam Gonzalez is a 55 y.o. year old male with symptoms of nausea, vomiting and a bulge that would not reduce.  Description of procedure: The patient was brought into the operative suite. Anesthesia was administered with General endotracheal anesthesia. WHO checklist was applied. The patient was then placed in supine. The area was prepped and draped in the usual sterile fashion.  Next the area just above the inguinal ligament was anesthetized with Exparel. Then a oblique incision was made over this area. Cautery was used to dissect down to the external oblique fascia which was incised sharply down to the external inguinal ring. Once this was done the incarcerated hernia contents spontaneously reduced into the abdomen. Next the hernia and cord contents were isolated away from the inguinal canal. Everything within this content was fairly woody edematous making identification difficult. Eventually the sac was isolated and because identification was difficult and to the sac for better identification. Then got 360 isolation of the sac at the internal inguinal ring. I brought a piece of the bowel out from this hole which looked healthy nondilated. Once this is complete used cautery to cut through the sac in this area. Then sewed the peritoneal edge closed with 3-0 silk. Then my attention back towards the sac and contents of the scrotum. I first attempted to isolate the sac away from other structures but again due to difficulty identifying what was what I decided the safest thing would be to leave the remainder of the sac in place.   Therefore I turned my attention towards repair of the hernia. Sutured a 3 x 6 piece of Bard soft mesh  beginning at the inguinal ligament next to the pubic tubercle. I then used 0 Prolenes in interrupted fashion sewn to the inguinal ligament and conjoint tendon wholly around making a small slit for the spermatic cord and contents. Next I used the remainder of the Exparel size the fascia as well as subcutaneous space as well as the area 1 cm medial and 1 cm inferior to the ASIS. Once this was complete having closed the EAO fascia with a running 2-0 Vicryl. An close Scarpa's fascia with a running 3-0 Vicryl. I then closed skin with 4-0 Monocryl subcuticular stitch. Steri-Strips and covered or murmur put in place for dressing patient awoke from anesthesia and was prepped PACU in stable condition. All counts were correct.  Findings: incarcerated hernia with small bowel  Specimen: none  Implant: Bard soft mesh  Blood loss: 50cc  Local anesthesia: 68ml 1:2 Exparel: saline  Complications: none  Gurney Maxin, M.D. General, Bariatric, & Minimally Invasive Surgery [facility]

## 2015-08-02 NOTE — ED Provider Notes (Signed)
CSN: [id number]     Arrival date & time 08/01/15  1836 History   First MD Initiated Contact with Patient 08/01/15 2015     Chief Complaint  Patient presents with  . Hematuria  . Abdominal Pain  . Hernia     (Consider location/radiation/quality/duration/timing/severity/associated sxs/prior Treatment) Patient is a 55 y.o. male presenting with abdominal pain.  Abdominal Pain Associated symptoms: hematuria   Associated symptoms: no chest pain, no chills, no cough, no diarrhea, no dysuria, no fever, no nausea, no shortness of breath and no vomiting     26 y M w PMH previous R inguinal hernia repair who presents with two complaints.  Hematuria and L inguinal hernia.  Patient has had bright red painless hematuria since today.  No fever/chills, no urethral trauma.  He has never had hematuria before.  Second complaint of L inguinal hernia pain.  He has had a L inguinal hernia for approx 1 year and it comes out daily, he is usually able to push the hernia back in but was unable to today.  He has pain in his left groin that is severe, no alleviating/aggravating fx, non-radiating.  Past Medical History  Diagnosis Date  . Hernia    Past Surgical History  Procedure Laterality Date  . Hernia repair     No family history on file. Social History  Substance Use Topics  . Smoking status: Current Some Day Smoker    Types: Cigars, Cigarettes  . Smokeless tobacco: Never Used  . Alcohol Use: 0.6 oz/week    1 Cans of beer per week    Review of Systems  Constitutional: Negative for fever and chills.  Eyes: Negative for redness.  Respiratory: Negative for cough and shortness of breath.   Cardiovascular: Negative for chest pain.  Gastrointestinal: Positive for abdominal pain. Negative for nausea, vomiting and diarrhea.  Genitourinary: Positive for hematuria. Negative for dysuria.       L groin pain  Skin: Negative for rash.  Neurological: Negative for headaches.  All other systems reviewed  and are negative.     Allergies  Review of patient's allergies indicates no known allergies.  Home Medications   Prior to Admission medications   Medication Sig Start Date End Date Taking? Authorizing Provider  aspirin EC 325 MG tablet Take 162.5-325 mg by mouth every 6 (six) hours as needed (pain).   Yes Historical Provider, MD  HYDROcodone-acetaminophen (NORCO/VICODIN) 5-325 MG tablet Take 1 tablet by mouth every 4 (four) hours as needed for severe pain. 07/22/15   Carmin Muskrat, MD   BP 111/68 mmHg  Pulse 64  Temp(Src) 97.7 F (36.5 C) (Oral)  Resp 16  SpO2 100% Physical Exam  Constitutional: He is oriented to person, place, and time. No distress.  HENT:  Head: Normocephalic and atraumatic.  Eyes: EOM are normal. Pupils are equal, round, and reactive to light.  Neck: Normal range of motion. Neck supple.  Cardiovascular: Normal rate.   Pulmonary/Chest: Effort normal. No respiratory distress.  Abdominal: Soft. He exhibits mass (tender, irreducible, L inguinal hernia.).  Genitourinary:  Small amount of dark blood at the urethral meatus w/o active bleeding   Musculoskeletal: Normal range of motion.  Neurological: He is alert and oriented to person, place, and time.  Skin: No rash noted. He is not diaphoretic.  Psychiatric: He has a normal mood and affect.    ED Course  Procedures (including critical care time) Labs Review Labs Reviewed  LIPASE, BLOOD - Abnormal; Notable for the following:  Lipase 73 (*)    All other components within normal limits  COMPREHENSIVE METABOLIC PANEL - Abnormal; Notable for the following:    ALT 15 (*)    Total Bilirubin 0.2 (*)    All other components within normal limits  URINALYSIS, ROUTINE W REFLEX MICROSCOPIC (NOT AT [facility]) - Abnormal; Notable for the following:    Color, Urine RED (*)    APPearance TURBID (*)    Hgb urine dipstick LARGE (*)    Bilirubin Urine MODERATE (*)    Ketones, ur 15 (*)    Protein, ur 100 (*)     Leukocytes, UA MODERATE (*)    All other components within normal limits  URINE MICROSCOPIC-ADD ON - Abnormal; Notable for the following:    Squamous Epithelial / LPF 0-5 (*)    All other components within normal limits  CBC - Abnormal; Notable for the following:    WBC 11.3 (*)    All other components within normal limits  CBC  CREATININE, SERUM    Imaging Review Ct Abdomen Pelvis W Contrast  08/02/2015  CLINICAL DATA:  55 year old male with left-sided inguinal hernia EXAM: CT ABDOMEN AND PELVIS WITH CONTRAST TECHNIQUE: Multidetector CT imaging of the abdomen and pelvis was performed using the standard protocol following bolus administration of intravenous contrast. CONTRAST:  137mL OMNIPAQUE IOHEXOL 300 MG/ML  SOLN COMPARISON:  None. FINDINGS: The visualized lung bases are clear. No intra-abdominal free air or free fluid. The liver, gallbladder, pancreas, spleen, adrenal glands, kidneys, visualized ureters appear unremarkable. The there is apparent diffuse thickening of the bladder wall which may be related to inflammatory changes of the adjacent bowel. Cystitis is less likely but not excluded. Correlation with urinalysis recommended. The prostate gland is mildly enlarged measuring 4.5 cm in diameter. There is a left inguinal hernia containing loops of small bowel. There is narrowing of the bowel at the level of the hernia defect. There is dilatation and inflammatory changes of the herniated loops as well as loops of bowel proximal to the hernia compatible with obstruction and strangulation. Small amount of fluid noted in the hernia sac. No definite pneumatosis identified. Evaluation of the colon is limited due to non opacification with oral contrast. The appendix is unremarkable. The abdominal aorta and IVC appear unremarkable. No portal venous gas identified. There is no adenopathy. The osseous structures are intact. 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  IMPRESSION: Left inguinal hernia containing loops of small bowel with evidence of obstruction and strangulation/incarceration. There is no pneumatosis or portal venous gas. Clinical correlation and surgical consult is advised. Electronically Signed   By: Anner Crete M.D.   On: 08/02/2015 00:58   I have personally reviewed and evaluated these images and lab results as part of my medical decision-making.   EKG Interpretation None      MDM   Final diagnoses:  1.  Incarcerated Left inguinal hernia without strangulation 2.  Left groin pain 3.  hematuria    23 y M w PMH previous R inguinal hernia repair who presents with two complaints.  Hematuria and L inguinal hernia.  Unable to reduce the hernia on exam.  Concern for incarcerated L inguinal hernia.  Cbc/cmp obtained and unremarkable.  Lactic acid normal.  Will obtain ct  Ct shows bowel containing L inguinal hernia.  Will consult surg for incarcerated inguinal hernia.  Awaiting surgery consult.  Patient care transferred to Dr. Rex Kras at [facility], awaiting surgery consultation.  Jarome Matin, MD 08/02/15 [place], MD 08/02/15 [place], MD 08/05/15 [phone]

## 2015-08-02 NOTE — ED Notes (Signed)
Family at bedside. 

## 2015-08-02 NOTE — H&P (Signed)
Adam Gonzalez is an 55 y.o. male.   Chief Complaint: left groin bulge HPI: 55 yo male with 10h of groin swelling and pain. He has known he had a hernia for about 6 months and frequently had not manually reduce the hernia. He has previously had nausea and vomiting. Today he has had nausea and vomited water 1x. He denies fevers or chills. He also states he had hematuria earlier today.  Past Medical History  Diagnosis Date  . Hernia     Past Surgical History  Procedure Laterality Date  . Hernia repair      No family history on file. Social History:  reports that he has been smoking Cigars and Cigarettes.  He has never used smokeless tobacco. He reports that he drinks about 0.6 oz of alcohol per week. He reports that he does not use illicit drugs.  Allergies: No Known Allergies   (Not in a hospital admission)  Results for orders placed or performed during the hospital encounter of 08/01/15 (from the past 48 hour(s))  Lipase, blood     Status: Abnormal   Collection Time: 08/01/15  6:54 PM  Result Value Ref Range   Lipase 73 (H) 11 - 51 U/L  Comprehensive metabolic panel     Status: Abnormal   Collection Time: 08/01/15  6:54 PM  Result Value Ref Range   Sodium 139 135 - 145 mmol/L   Potassium 3.8 3.5 - 5.1 mmol/L   Chloride 102 101 - 111 mmol/L   CO2 25 22 - 32 mmol/L   Glucose, Bld 96 65 - 99 mg/dL   BUN 12 6 - 20 mg/dL   Creatinine, Ser 1.00 0.61 - 1.24 mg/dL   Calcium 9.1 8.9 - 10.3 mg/dL   Total Protein 6.5 6.5 - 8.1 g/dL   Albumin 3.6 3.5 - 5.0 g/dL   AST 23 15 - 41 U/L   ALT 15 (L) 17 - 63 U/L   Alkaline Phosphatase 57 38 - 126 U/L   Total Bilirubin 0.2 (L) 0.3 - 1.2 mg/dL   GFR calc non Af Amer >60 >60 mL/min   GFR calc Af Amer >60 >60 mL/min    Comment: (NOTE) The eGFR has been calculated using the CKD EPI equation. This calculation has not been validated in all clinical situations. eGFR's persistently <60 mL/min signify possible Chronic Kidney Disease.    Anion  gap 12 5 - 15  CBC     Status: None   Collection Time: 08/01/15  6:54 PM  Result Value Ref Range   WBC 5.6 4.0 - 10.5 K/uL   RBC 4.88 4.22 - 5.81 MIL/uL   Hemoglobin 14.2 13.0 - 17.0 g/dL   HCT 43.5 39.0 - 52.0 %   MCV 89.1 78.0 - 100.0 fL   MCH 29.1 26.0 - 34.0 pg   MCHC 32.6 30.0 - 36.0 g/dL   RDW 13.0 11.5 - 15.5 %   Platelets 259 150 - 400 K/uL  Urinalysis, Routine w reflex microscopic (not at [facility], [facility], [facility])     Status: Abnormal   Collection Time: 08/01/15  7:42 PM  Result Value Ref Range   Color, Urine RED (A) YELLOW    Comment: BIOCHEMICALS MAY BE AFFECTED BY COLOR   APPearance TURBID (A) CLEAR   Specific Gravity, Urine 1.013 1.005 - 1.030   pH 6.0 5.0 - 8.0   Glucose, UA NEGATIVE NEGATIVE mg/dL   Hgb urine dipstick LARGE (A) NEGATIVE   Bilirubin Urine MODERATE (A) NEGATIVE   Ketones, ur  15 (A) NEGATIVE mg/dL   Protein, ur 100 (A) NEGATIVE mg/dL   Nitrite NEGATIVE NEGATIVE   Leukocytes, UA MODERATE (A) NEGATIVE  Urine microscopic-add on     Status: Abnormal   Collection Time: 08/01/15  7:42 PM  Result Value Ref Range   Squamous Epithelial / LPF 0-5 (A) NONE SEEN   WBC, UA 6-30 0 - 5 WBC/hpf   RBC / HPF TOO NUMEROUS TO COUNT 0 - 5 RBC/hpf   Bacteria, UA NONE SEEN NONE SEEN   No results found.  Review of Systems  Constitutional: Negative for fever and chills.  HENT: Negative for hearing loss.   Eyes: Negative for blurred vision and double vision.  Respiratory: Negative for cough and hemoptysis.   Cardiovascular: Negative for chest pain and palpitations.  Gastrointestinal: Positive for nausea, vomiting and abdominal pain. Negative for constipation and blood in stool.  Genitourinary: Positive for hematuria. Negative for dysuria and urgency.  Musculoskeletal: Negative for myalgias and neck pain.  Skin: Negative for itching and rash.  Neurological: Negative for dizziness, tingling and headaches.  Endo/Heme/Allergies: Does not bruise/bleed easily.  Psychiatric/Behavioral:  Negative for depression and suicidal ideas.    Blood pressure 128/86, pulse 60, temperature 98.7 F (37.1 C), temperature source Oral, resp. rate 22, SpO2 97 %. Physical Exam  Constitutional: He is oriented to person, place, and time. He appears well-developed and well-nourished.  HENT:  Head: Normocephalic and atraumatic.  Eyes: Conjunctivae are normal. Pupils are equal, round, and reactive to light.  Neck: Normal range of motion. Neck supple.  Cardiovascular: Normal rate and regular rhythm.   Respiratory: Breath sounds normal.  GI: He exhibits distension. He exhibits no mass. There is tenderness. There is no rebound. A hernia is present. Hernia confirmed positive in the left inguinal area.  Musculoskeletal: He exhibits no edema or tenderness.  Neurological: He is alert and oriented to person, place, and time.  Skin: Skin is warm and dry.  Psychiatric: He has a normal mood and affect. His behavior is normal.     Assessment/Plan Incarcerated left inguinal hernia with obstructive symptoms and dilated loops of bowel on CT scan. No signs of strangulation -OR for reduction and repair -NG placement   Adam Gonzalez 08/02/2015, 12:52 AM

## 2015-08-02 NOTE — Transfer of Care (Signed)
Immediate Anesthesia Transfer of Care Note  Patient: Adam Gonzalez  Procedure(s) Performed: Procedure(s): HERNIA REPAIR INGUINAL ADULT (Left)  Patient Location: PACU  Anesthesia Type:General  Level of Consciousness: awake, oriented and patient cooperative  Airway & Oxygen Therapy: Patient Spontanous Breathing and Patient connected to nasal cannula oxygen  Post-op Assessment: Report given to RN and Post -op Vital signs reviewed and stable  Post vital signs: Reviewed and stable  Last Vitals:  Filed Vitals:   08/02/15 0100 08/02/15 0115  BP: 128/83 125/86  Pulse: 70 67  Temp:    Resp:      Complications: No apparent anesthesia complications

## 2015-08-02 NOTE — Anesthesia Procedure Notes (Signed)
Procedure Name: Intubation Date/Time: 08/02/2015 2:01 AM Performed by: Hollie Salk Z Pre-anesthesia Checklist: Patient identified, Timeout performed, Emergency Drugs available, Suction available and Patient being monitored Patient Re-evaluated:Patient Re-evaluated prior to inductionOxygen Delivery Method: Circle system utilized Preoxygenation: Pre-oxygenation with 100% oxygen Intubation Type: IV induction, Rapid sequence and Cricoid Pressure applied Laryngoscope Size: Mac and 4 Grade View: Grade II Tube type: Oral Tube size: 7.5 mm Number of attempts: 1 Airway Equipment and Method: Stylet Placement Confirmation: ETT inserted through vocal cords under direct vision,  breath sounds checked- equal and bilateral and positive ETCO2 Secured at: 22 cm Tube secured with: Tape Dental Injury: Teeth and Oropharynx as per pre-operative assessment

## 2015-08-02 NOTE — Progress Notes (Signed)
Pt. Arrived to the unit at the start of shift. Left groin area bandage is clean, dry, and intact. Some facial redness and swelling noted to the right cheek area and nose. Pt. States "that is from a past surgery". Will continue to monitor. Call bell within reach, Pt. Has been educated about using phone/call bell for assistance with getting up.

## 2015-08-02 NOTE — Progress Notes (Signed)
Pt. Is refusing Ice to the surgical area at this time. Pt. Has been educated, will continue to encourage. Call bell within reach.

## 2015-08-02 NOTE — Anesthesia Postprocedure Evaluation (Signed)
Anesthesia Post Note  Patient: Marck Mallis  Procedure(s) Performed: Procedure(s) (LRB): HERNIA REPAIR INGUINAL ADULT (Left)  Patient location during evaluation: PACU Anesthesia Type: General Level of consciousness: awake and alert Pain management: pain level controlled Vital Signs Assessment: post-procedure vital signs reviewed and stable Respiratory status: spontaneous breathing, nonlabored ventilation, respiratory function stable and patient connected to nasal cannula oxygen Cardiovascular status: blood pressure returned to baseline and stable Postop Assessment: no signs of nausea or vomiting Anesthetic complications: no    Last Vitals:  Filed Vitals:   08/02/15 0530 08/02/15 0545  BP: 112/83 114/82  Pulse: 76 73  Temp:    Resp: 16 13    Last Pain:  Filed Vitals:   08/02/15 0552  PainSc: 7                  Noha Milberger DAVID

## 2015-08-02 NOTE — Anesthesia Preprocedure Evaluation (Signed)
Anesthesia Evaluation  Patient identified by MRN, date of birth, ID band Patient awake    Reviewed: Allergy & Precautions, NPO status , Patient's Chart, lab work & pertinent test results  Airway Mallampati: I  TM Distance: >3 FB Neck ROM: Full    Dental   Pulmonary Current Smoker,    Pulmonary exam normal        Cardiovascular Normal cardiovascular exam     Neuro/Psych    GI/Hepatic   Endo/Other    Renal/GU      Musculoskeletal   Abdominal   Peds  Hematology   Anesthesia Other Findings   Reproductive/Obstetrics                             Anesthesia Physical Anesthesia Plan  ASA: II and emergent  Anesthesia Plan: General   Post-op Pain Management:    Induction: Intravenous, Rapid sequence and Cricoid pressure planned  Airway Management Planned: Oral ETT  Additional Equipment:   Intra-op Plan:   Post-operative Plan: Extubation in OR  Informed Consent: I have reviewed the patients History and Physical, chart, labs and discussed the procedure including the risks, benefits and alternatives for the proposed anesthesia with the patient or authorized representative who has indicated his/her understanding and acceptance.     Plan Discussed with: CRNA and Surgeon  Anesthesia Plan Comments:         Anesthesia Quick Evaluation  

## 2015-08-03 ENCOUNTER — Encounter (HOSPITAL_COMMUNITY): Payer: Self-pay | Admitting: General Surgery

## 2015-08-03 LAB — BASIC METABOLIC PANEL
ANION GAP: 7 (ref 5–15)
BUN: 6 mg/dL (ref 6–20)
CHLORIDE: 103 mmol/L (ref 101–111)
CO2: 27 mmol/L (ref 22–32)
Calcium: 8.4 mg/dL — ABNORMAL LOW (ref 8.9–10.3)
Creatinine, Ser: 0.89 mg/dL (ref 0.61–1.24)
GFR calc non Af Amer: >60 mL/min (ref >60)
GLUCOSE: 116 mg/dL — AB (ref 65–99)
POTASSIUM: 3.9 mmol/L (ref 3.5–5.1)
Sodium: 137 mmol/L (ref 135–145)

## 2015-08-03 LAB — CBC
HEMATOCRIT: 41.1 % (ref 39.0–52.0)
HEMOGLOBIN: 13.9 g/dL (ref 13.0–17.0)
MCH: 30 pg (ref 26.0–34.0)
MCHC: 33.8 g/dL (ref 30.0–36.0)
MCV: 88.8 fL (ref 78.0–100.0)
Platelets: 196 10*3/uL (ref 150–400)
RBC: 4.63 MIL/uL (ref 4.22–5.81)
RDW: 13.1 % (ref 11.5–15.5)
WBC: 9.2 10*3/uL (ref 4.0–10.5)

## 2015-08-03 MED ORDER — POLYETHYLENE GLYCOL 3350 17 G PO PACK
17.0000 g | PACK | Freq: Every day | ORAL | Status: DC
  Administered 2015-08-03: 17 g via ORAL
  Filled 2015-08-03: qty 1

## 2015-08-03 MED ORDER — OXYCODONE HCL 5 MG PO TABS: 5.0000 mg | ORAL_TABLET | ORAL | Status: DC | PRN

## 2015-08-03 NOTE — Discharge Summary (Signed)
Patient ID: Adam Gonzalez MRN: [id number] DOB/AGE: March 01, 1961 55 y.o.  Admit date: 08/01/2015 Discharge date: 08/03/2015  Procedures: open repair of left inguinal hernia with mesh  Consults: None  Reason for Admission:  55 yo male with 10h of groin swelling and pain. He has known he had a hernia for about 6 months and frequently had not manually reduce the hernia. He has previously had nausea and vomiting. Today he has had nausea and vomited water 1x. He denies fevers or chills. He also states he had hematuria earlier today.  Admission Diagnoses:  1. Incarcerated left inguinal hernia 2. hematuria  Hospital Course: The patient was admitted and taken to the OR where he underwent the above procedure.  He tolerated this well.  On POD 1, he was tolerating a solid diet and his pain was well controlled.  He was passing flatus and had no nausea.  He was able to void postoperatively.  This no longer was bloody.  He has outpatient follow up set up with urology for this.  He was otherwise stable for dc home.  PE: Abd: soft, appropriately tender, +BS, ND, incision c/d/i with dressing in place  Discharge Diagnoses:  Active Problems:   Incarcerated left inguinal hernia s/p repair Hematuria, improved  Discharge Medications:   Medication List    STOP taking these medications        HYDROcodone-acetaminophen 5-325 MG tablet  Commonly known as:  NORCO/VICODIN      TAKE these medications        aspirin EC 325 MG tablet  Take 162.5-325 mg by mouth every 6 (six) hours as needed (pain).     oxyCODONE 5 MG immediate release tablet  Commonly known as:  Oxy IR/ROXICODONE  Take 1-2 tablets (5-10 mg total) by mouth every 4 (four) hours as needed for moderate pain.        Discharge Instructions:     Follow-up Information    Follow up with DAHLSTEDT, Lillette Boxer, MD. Schedule an appointment as soon as possible for a visit in 2 days.   Specialty:  Urology   Why:  hematuria   Contact  information:   [place] [address] [phone]       Follow up with Mickeal Skinner, MD. Schedule an appointment as soon as possible for a visit in 3 weeks.   Specialty:  General Surgery   Contact information:   [place] [address] [phone]       Signed: Henreitta Cea 08/03/2015, 8:25 AM

## 2015-08-03 NOTE — Discharge Instructions (Signed)
[facility] _______[facility] ° °UMBILICAL OR INGUINAL HERNIA REPAIR: POST OP INSTRUCTIONS ° °Always review your discharge instruction sheet given to you by the facility where your surgery was performed. °IF YOU HAVE DISABILITY OR FAMILY LEAVE FORMS, YOU MUST BRING THEM TO THE OFFICE FOR PROCESSING.   °DO NOT GIVE THEM TO YOUR DOCTOR. ° °1. A  prescription for pain medication may be given to you upon discharge.  Take your pain medication as prescribed, if needed.  If narcotic pain medicine is not needed, then you may take acetaminophen (Tylenol) or ibuprofen (Advil) as needed. °2. Take your usually prescribed medications unless otherwise directed. °3. If you need a refill on your pain medication, please contact your pharmacy.  They will contact our office to request authorization. Prescriptions will not be filled after 5 pm or on week-ends. °4. You should follow a light diet the first 24 hours after arrival home, such as soup and crackers, etc.  Be sure to include lots of fluids daily.  Resume your normal diet the day after surgery. °5. Most patients will experience some swelling and bruising around the umbilicus or in the groin and scrotum.  Ice packs and reclining will help.  Swelling and bruising can take several days to resolve.  °6. It is common to experience some constipation if taking pain medication after surgery.  Increasing fluid intake and taking a stool softener (such as Colace) will usually help or prevent this problem from occurring.  A mild laxative (Milk of Magnesia or Miralax) should be taken according to package directions if there are no bowel movements after 48 hours. °7. Unless discharge instructions indicate otherwise, you may remove your bandages 24-48 hours after surgery, and you may shower at that time.  You may have steri-strips (small skin tapes) in place directly over the incision.  These strips should be left on the skin for 7-10 days.  If your surgeon used skin glue on the  incision, you may shower in 24 hours.  The glue will flake off over the next 2-3 weeks.  Any sutures or staples will be removed at the office during your follow-up visit. °8. ACTIVITIES:  You may resume regular (light) daily activities beginning the next day--such as daily self-care, walking, climbing stairs--gradually increasing activities as tolerated.  You may have sexual intercourse when it is comfortable.  Refrain from any heavy lifting or straining until approved by your doctor. °a. You may drive when you are no longer taking prescription pain medication, you can comfortably wear a seatbelt, and you can safely maneuver your car and apply brakes. °b. RETURN TO WORK:  __________________________________________________________ °9. You should see your doctor in the office for a follow-up appointment approximately 2-3 weeks after your surgery.  Make sure that you call for this appointment within a day or two after you arrive home to insure a convenient appointment time. °10. OTHER INSTRUCTIONS:  __________________________________________________________________________________________________________________________________________________________________________________________  °WHEN TO CALL YOUR DOCTOR: °1. Fever over 101.0 °2. Inability to urinate °3. Nausea and/or vomiting °4. Extreme swelling or bruising °5. Continued bleeding from incision. °6. Increased pain, redness, or drainage from the incision ° °The clinic staff is available to answer your questions during regular business hours.  Please don’t hesitate to call and ask to speak to one of the nurses for clinical concerns.  If you have a medical emergency, go to the nearest emergency room or call 911.  A surgeon from [facility] is always on call at the hospital ° ° °  [address], [place], [place], [place]  [address] ?  [address], [place], [place]   [address] [phone] ? [phone] ? FAX [phone] Web site:  [email or web address]   Hematuria, Adult Hematuria is blood in your urine. It can be caused by a bladder infection, kidney infection, prostate infection, kidney stone, or cancer of your urinary tract. Infections can usually be treated with medicine, and a kidney stone usually will pass through your urine. If neither of these is the cause of your hematuria, further workup to find out the reason may be needed. It is very important that you tell your health care provider about any blood you see in your urine, even if the blood stops without treatment or happens without causing pain. Blood in your urine that happens and then stops and then happens again can be a symptom of a very serious condition. Also, pain is not a symptom in the initial stages of many urinary cancers. HOME CARE INSTRUCTIONS   Drink lots of fluid, 3-4 quarts a day. If you have been diagnosed with an infection, cranberry juice is especially recommended, in addition to large amounts of water.  Avoid caffeine, tea, and carbonated beverages because they tend to irritate the bladder.  Avoid alcohol because it may irritate the prostate.  Take all medicines as directed by your health care provider.  If you were prescribed an antibiotic medicine, finish it all even if you start to feel better.  If you have been diagnosed with a kidney stone, follow your health care provider's instructions regarding straining your urine to catch the stone.  Empty your bladder often. Avoid holding urine for long periods of time.  After a bowel movement, women should cleanse front to back. Use each tissue only once.  Empty your bladder before and after sexual intercourse if you are a male. SEEK MEDICAL CARE IF:  You develop back pain.  You have a fever.  You have a feeling of sickness in your stomach (nausea) or vomiting.  Your symptoms are not better in 3 days. Return sooner if you are getting worse. SEEK IMMEDIATE MEDICAL CARE IF:     You develop severe vomiting and are unable to keep the medicine down.  You develop severe back or abdominal pain despite taking your medicines.  You begin passing a large amount of blood or clots in your urine.  You feel extremely weak or faint, or you pass out. MAKE SURE YOU:   Understand these instructions.  Will watch your condition.  Will get help right away if you are not doing well or get worse.   This information is not intended to replace advice given to you by your health care provider. Make sure you discuss any questions you have with your health care provider.   Document Released: 07/03/2005 Document Revised: 07/24/2014 Document Reviewed: 03/03/2013 Elsevier Interactive Patient Education [employer].

## 2015-08-03 NOTE — Progress Notes (Signed)
NURSING PROGRESS NOTE  Adam Gonzalez [id number] Discharge Data: 08/03/2015 9:31 AM Attending Provider: Nolon Nations, MD PCP:No PCP Per Patient     [name] to be D/C'd Home per MD order.  Discussed with the patient the After Visit Summary and all questions fully answered. All IV's discontinued with no bleeding noted. All belongings returned to patient for patient to take home.   Last Vital Signs:  Blood pressure 113/69, pulse 66, temperature 98.3 F (36.8 C), temperature source Oral, resp. rate 18, height 5\' 8"  (1.727 m), weight 65.635 kg (144 lb 11.2 oz), SpO2 98 %.  Discharge Medication List   Medication List    STOP taking these medications        HYDROcodone-acetaminophen 5-325 MG tablet  Commonly known as:  NORCO/VICODIN      TAKE these medications        aspirin EC 325 MG tablet  Take 162.5-325 mg by mouth every 6 (six) hours as needed (pain).     oxyCODONE 5 MG immediate release tablet  Commonly known as:  Oxy IR/ROXICODONE  Take 1-2 tablets (5-10 mg total) by mouth every 4 (four) hours as needed for moderate pain.         Charolette Child, RN

## 2015-08-11 ENCOUNTER — Other Ambulatory Visit (INDEPENDENT_AMBULATORY_CARE_PROVIDER_SITE_OTHER): Payer: Self-pay | Admitting: Physician Assistant

## 2015-08-11 DIAGNOSIS — K4030 Unilateral inguinal hernia, with obstruction, without gangrene, not specified as recurrent: Secondary | ICD-10-CM

## 2015-08-16 ENCOUNTER — Ambulatory Visit
Admission: RE | Admit: 2015-08-16 | Discharge: 2015-08-16 | Disposition: A | Discharge status: Home / Self Care | Payer: 59 | Source: Ambulatory Visit | Attending: Physician Assistant | Admitting: Physician Assistant

## 2015-08-16 DIAGNOSIS — K4030 Unilateral inguinal hernia, with obstruction, without gangrene, not specified as recurrent: Secondary | ICD-10-CM

## 2015-09-27 ENCOUNTER — Encounter (HOSPITAL_COMMUNITY): Payer: Self-pay | Admitting: Vascular Surgery

## 2015-09-27 ENCOUNTER — Emergency Department (HOSPITAL_COMMUNITY): Payer: Self-pay

## 2015-09-27 DIAGNOSIS — Y9289 Other specified places as the place of occurrence of the external cause: Secondary | ICD-10-CM | POA: Insufficient documentation

## 2015-09-27 DIAGNOSIS — S2241XA Multiple fractures of ribs, right side, initial encounter for closed fracture: Secondary | ICD-10-CM | POA: Insufficient documentation

## 2015-09-27 DIAGNOSIS — Y9389 Activity, other specified: Secondary | ICD-10-CM | POA: Insufficient documentation

## 2015-09-27 DIAGNOSIS — Y998 Other external cause status: Secondary | ICD-10-CM | POA: Insufficient documentation

## 2015-09-27 DIAGNOSIS — R319 Hematuria, unspecified: Secondary | ICD-10-CM | POA: Insufficient documentation

## 2015-09-27 DIAGNOSIS — R042 Hemoptysis: Secondary | ICD-10-CM | POA: Insufficient documentation

## 2015-09-27 DIAGNOSIS — R0602 Shortness of breath: Secondary | ICD-10-CM | POA: Insufficient documentation

## 2015-09-27 DIAGNOSIS — W1789XA Other fall from one level to another, initial encounter: Secondary | ICD-10-CM | POA: Insufficient documentation

## 2015-09-27 DIAGNOSIS — F17210 Nicotine dependence, cigarettes, uncomplicated: Secondary | ICD-10-CM | POA: Insufficient documentation

## 2015-09-27 LAB — CBC WITH DIFFERENTIAL/PLATELET
BASOS ABS: 0 10*3/uL (ref 0.0–0.1)
BASOS PCT: 1 %
Eosinophils Absolute: 0.2 10*3/uL (ref 0.0–0.7)
Eosinophils Relative: 3 %
HEMATOCRIT: 44 % (ref 39.0–52.0)
HEMOGLOBIN: 14 g/dL (ref 13.0–17.0)
LYMPHS PCT: 13 %
Lymphs Abs: 1.1 10*3/uL (ref 0.7–4.0)
MCH: 28.6 pg (ref 26.0–34.0)
MCHC: 31.8 g/dL (ref 30.0–36.0)
MCV: 89.8 fL (ref 78.0–100.0)
Monocytes Absolute: 0.7 10*3/uL (ref 0.1–1.0)
Monocytes Relative: 8 %
NEUTROS ABS: 6.2 10*3/uL (ref 1.7–7.7)
NEUTROS PCT: 75 %
Platelets: 280 10*3/uL (ref 150–400)
RBC: 4.9 MIL/uL (ref 4.22–5.81)
RDW: 13.7 % (ref 11.5–15.5)
WBC: 8.2 10*3/uL (ref 4.0–10.5)

## 2015-09-27 LAB — COMPREHENSIVE METABOLIC PANEL
ALBUMIN: 4.1 g/dL (ref 3.5–5.0)
ALK PHOS: 61 U/L (ref 38–126)
ALT: 28 U/L (ref 17–63)
AST: 25 U/L (ref 15–41)
Anion gap: 16 — ABNORMAL HIGH (ref 5–15)
BILIRUBIN TOTAL: 0.9 mg/dL (ref 0.3–1.2)
BUN: 16 mg/dL (ref 6–20)
CALCIUM: 9.9 mg/dL (ref 8.9–10.3)
CO2: 25 mmol/L (ref 22–32)
CREATININE: 1 mg/dL (ref 0.61–1.24)
Chloride: 99 mmol/L — ABNORMAL LOW (ref 101–111)
GFR calc Af Amer: >60 mL/min (ref >60)
GFR calc non Af Amer: >60 mL/min (ref >60)
GLUCOSE: 121 mg/dL — AB (ref 65–99)
Potassium: 4.4 mmol/L (ref 3.5–5.1)
Sodium: 140 mmol/L (ref 135–145)
TOTAL PROTEIN: 7 g/dL (ref 6.5–8.1)

## 2015-09-27 MED ORDER — OXYCODONE-ACETAMINOPHEN 5-325 MG PO TABS
1.0000 | ORAL_TABLET | Freq: Once | ORAL | Status: AC
  Administered 2015-09-27: 1 via ORAL

## 2015-09-27 MED ORDER — OXYCODONE-ACETAMINOPHEN 5-325 MG PO TABS
ORAL_TABLET | ORAL | Status: AC
  Filled 2015-09-27: qty 1

## 2015-09-27 NOTE — ED Notes (Signed)
Pt reports to the ED for eval of pain following a fall on Thursday. Per pt he fell approx 10-11 ft off a garbage compactor. States he did hit his head and LOC. He has also been having hemoptysis and hematuria. Pt also reports that today he is also having severe back pain and SOB. Pt A&Ox4, resp e/u, and skin warm and dry.

## 2015-09-28 ENCOUNTER — Emergency Department (HOSPITAL_COMMUNITY)
Admission: EM | Admit: 2015-09-28 | Discharge: 2015-09-28 | Disposition: A | Discharge status: Home / Self Care | Payer: Self-pay | Attending: Emergency Medicine | Admitting: Emergency Medicine

## 2015-09-28 ENCOUNTER — Emergency Department (HOSPITAL_COMMUNITY): Payer: Self-pay

## 2015-09-28 DIAGNOSIS — S2231XA Fracture of one rib, right side, initial encounter for closed fracture: Secondary | ICD-10-CM

## 2015-09-28 LAB — URINALYSIS, ROUTINE W REFLEX MICROSCOPIC
Bilirubin Urine: NEGATIVE
Glucose, UA: NEGATIVE mg/dL
Hgb urine dipstick: NEGATIVE
Ketones, ur: NEGATIVE mg/dL
Leukocytes, UA: NEGATIVE
Nitrite: NEGATIVE
Protein, ur: NEGATIVE mg/dL
Specific Gravity, Urine: 1.028 (ref 1.005–1.030)
pH: 5 (ref 5.0–8.0)

## 2015-09-28 MED ORDER — OXYCODONE HCL 5 MG PO TABS: 5.0000 mg | ORAL_TABLET | Freq: Two times a day (BID) | ORAL | Status: DC | PRN

## 2015-09-28 MED ORDER — IOHEXOL 300 MG/ML  SOLN
100.0000 mL | Freq: Once | INTRAMUSCULAR | Status: AC | PRN
  Administered 2015-09-28: 100 mL via INTRAVENOUS

## 2015-09-28 MED ORDER — MORPHINE SULFATE (PF) 4 MG/ML IV SOLN
6.0000 mg | Freq: Once | INTRAVENOUS | Status: AC
  Administered 2015-09-28: 6 mg via INTRAVENOUS
  Filled 2015-09-28: qty 2

## 2015-09-28 NOTE — ED Notes (Signed)
Patient transported to CT 

## 2015-09-28 NOTE — ED Notes (Signed)
MD at bedside. 

## 2015-09-28 NOTE — Discharge Instructions (Signed)
Rib Fracture Adam Gonzalez, you CT scan results are below.  You have multiple rib fracture and a lung mass which may be cancer.  Please follow up with a primary care doctor regarding these results.  Use the incentive spirometer 10x every 8 hours everday until your rib fractures heal to prevent pneumonia.  If any symptoms worsen, come back to the ED immediately. Thank you.  IMPRESSION: 1. Right sixth through eighth rib fractures with up to moderate displacement. No underlying contusion to explain hemoptysis history. 2. Mediastinal and hilar lymphadenopathy with central calcifications suggesting granulomatous disease, including sarcoidosis. 3. Right basilar airspace disease and 2 cm right upper lobe nodule. Recommend pulmonary referral and short follow up CT to differentiate infectious/inflammatory opacity from neoplastic nodule. 4. No evidence of acute intra-abdominal injury. 5. Bilateral femoral head avascular necrosis. A rib fracture is a break or crack in one of the bones of the ribs. The ribs are like a cage that goes around your upper chest. A broken or cracked rib is often painful, but most do not cause other problems. Most rib fractures heal on their own in 1-3 months. HOME CARE  Avoid activities that cause pain to the injured area. Protect your injured area.  Slowly increase activity as told by your doctor.  Take medicine as told by your doctor.  Put ice on the injured area for the first 1-2 days after you have been treated or as told by your doctor.  Put ice in a plastic bag.  Place a towel between your skin and the bag.  Leave the ice on for 15-20 minutes at a time, every 2 hours while you are awake.  Do deep breathing as told by your doctor. You may be told to:  Take deep breaths many times a day.  Cough many times a day while hugging a pillow.  Use a device (incentive spirometer) to perform deep breathing many times a day.  Drink enough fluids to keep your pee (urine)  clear or pale yellow.   Do not wear a rib belt or binder. These do not allow you to breathe deeply. GET HELP RIGHT AWAY IF:   You have a fever.  You have trouble breathing.   You cannot stop coughing.  You cough up thick or bloody spit (mucus).   You feel sick to your stomach (nauseous), throw up (vomit), or have belly (abdominal) pain.   Your pain gets worse and medicine does not help.  MAKE SURE YOU:   Understand these instructions.  Will watch your condition.  Will get help right away if you are not doing well or get worse.   This information is not intended to replace advice given to you by your health care provider. Make sure you discuss any questions you have with your health care provider.   Document Released: 04/11/2008 Document Revised: 10/28/2012 Document Reviewed: 09/04/2012 Elsevier Interactive Patient Education 2016 Else[employer] Inc[employer]   Pulmonary Nodule  A pulmonary nodule is a small, round spot in your lung. It is usually found when pictures of your lungs are taken for other reasons. Most pulmonary nodules are not cancerous and do not cause symptoms. Tests will be done to make sure the nodule is not cancerous. Pulmonary nodules that are not cancerous usually do not require treatment. HOME CARE   Only take medicine as told by your doctor.  Follow up with your doctor as told. GET HELP IF:  You have trouble breathing when doing activities.  You feel sick or  more tired than normal.  You do not feel like eating.  You lose weight without trying to.  You have chills.  You have night sweats. GET HELP RIGHT AWAY IF:  You cannot catch your breath.  You start making whistling sounds when breathing (wheezing).  You have a cough that does not go away.  You cough up blood.  You are dizzy or feel like you are going to pass out.  You have sudden chest pain.  You have a fever or lasting symptoms for more than 2-3 days.  You have a fever and your  symptoms suddenly get worse. MAKE SURE YOU:  Understand these instructions.  Will watch your condition.  Will get help right away if you are not doing well or get worse.   This information is not intended to replace advice given to you by your health care provider. Make sure you discuss any questions you have with your health care provider.   Document Released: 08/05/2010 Document Revised: 11/17/2014 Document Reviewed: 12/23/2012 Elsevier Interactive Patient Education [employer].

## 2015-09-28 NOTE — ED Provider Notes (Signed)
CSN: [id number]     Arrival date & time 09/27/15  1924 History   By signing my name below, I, Rowan Blase, attest that this documentation has been prepared under the direction and in the presence of Everlene Balls, MD . Electronically Signed: Rowan Blase, Scribe. 09/28/2015. 1:14 AM.   Chief Complaint  Patient presents with  . Back Pain   The history is provided by the patient. No language interpreter was used.   HPI Comments:  Adam Gonzalez is a 55 y.o. male who presents to the Emergency Department s/p fall ~5 days ago complaining of moderate-severe, worsening back pain for the past 5 days. He states he was unable to move secondary to pain yesterday PTA. Pt states he fell ~15 ft off a garbage compactor, landing on his back on concrete. Pt reports associated episodes of hemoptysis after fall, shortness of breath, and 2 episodes of hematuria. Pt took Tylenol with temporary relief. He has been working since the fall. Pt denies blood in stool.  Past Medical History  Diagnosis Date  . Medical history non-contributory    Past Surgical History  Procedure Laterality Date  . Inguinal hernia repair Right   . Inguinal hernia repair Left 08/02/2015  . Nasal sinus surgery    . Inguinal hernia repair Left 08/02/2015    Procedure: HERNIA REPAIR INGUINAL ADULT;  Surgeon: Mickeal Skinner, MD;  Location: [facility];  Service: General;  Laterality: Left;   No family history on file. Social History  Substance Use Topics  . Smoking status: Current Some Day Smoker    Types: Cigars  . Smokeless tobacco: Never Used  . Alcohol Use: 1.8 oz/week    3 Cans of beer per week    Review of Systems  Respiratory: Positive for cough (hemoptysis).   Gastrointestinal: Negative for blood in stool.  Genitourinary: Positive for hematuria.  Musculoskeletal: Positive for back pain.  All other systems reviewed and are negative.  Allergies  Review of patient's allergies indicates no known allergies.  Home  Medications   Prior to Admission medications   Medication Sig Start Date End Date Taking? Authorizing Provider  oxyCODONE (OXY IR/ROXICODONE) 5 MG immediate release tablet Take 1-2 tablets (5-10 mg total) by mouth 2 (two) times daily as needed for severe pain. 09/28/15   Everlene Balls, MD   BP 127/78 mmHg  Pulse 70  Temp(Src) 97.7 F (36.5 C) (Oral)  Resp 18  Ht 5\' 9"  (1.753 m)  Wt 147 lb 3 oz (66.764 kg)  BMI 21.73 kg/m2  SpO2 94% Physical Exam  Constitutional: He is oriented to person, place, and time. Vital signs are normal. He appears well-developed and well-nourished.  Non-toxic appearance. He does not appear ill. No distress.  HENT:  Head: Normocephalic and atraumatic.  Nose: Nose normal.  Mouth/Throat: Oropharynx is clear and moist. No oropharyngeal exudate.  Eyes: Conjunctivae and EOM are normal. Pupils are equal, round, and reactive to light. No scleral icterus.  Neck: Normal range of motion. Neck supple. No tracheal deviation, no edema, no erythema and normal range of motion present. No thyroid mass and no thyromegaly present.  Cardiovascular: Normal rate, regular rhythm, S1 normal, S2 normal, normal heart sounds, intact distal pulses and normal pulses.  Exam reveals no gallop and no friction rub.   No murmur heard. Pulmonary/Chest: Effort normal and breath sounds normal. No respiratory distress. He has no wheezes. He has no rhonchi. He has no rales.  Abdominal: Soft. Normal appearance and bowel sounds are normal. He  exhibits no distension, no ascites and no mass. There is no hepatosplenomegaly. There is no tenderness. There is no rebound, no guarding and no CVA tenderness.  Musculoskeletal: Normal range of motion. He exhibits no edema or tenderness.  Lymphadenopathy:    He has no cervical adenopathy.  Neurological: He is alert and oriented to person, place, and time. He has normal strength. No cranial nerve deficit or sensory deficit.  Skin: Skin is warm, dry and intact. No  petechiae and no rash noted. He is not diaphoretic. No erythema. No pallor.  Psychiatric: He has a normal mood and affect. His behavior is normal. Judgment normal.  Nursing note and vitals reviewed.   ED Course  Procedures  DIAGNOSTIC STUDIES:  Oxygen Saturation is 100% on RA, normal by my interpretation.    COORDINATION OF CARE:  1:00 AM Will order CT to evaluate possible internal injuries. Will administer pain medication. Discussed treatment plan with pt at bedside and pt agreed to plan.  2:54 AM Informed pt of imaging results. Will refer to primary doctor for re-evaluation of nodule on lung. Will discharge with pain medication and incentive spirometer .  Labs Review Labs Reviewed  COMPREHENSIVE METABOLIC PANEL - Abnormal; Notable for the following:    Chloride 99 (*)    Glucose, Bld 121 (*)    Anion gap 16 (*)    All other components within normal limits  CBC WITH DIFFERENTIAL/PLATELET  URINALYSIS, ROUTINE W REFLEX MICROSCOPIC (NOT AT [facility])    Imaging Review Dg Chest 2 View  09/27/2015  CLINICAL DATA:  Shortness of breath 5 days. EXAM: CHEST  2 VIEW COMPARISON:  06/28/2015 FINDINGS: Lungs are adequately inflated as patient is slightly rotated to the right. Stable linear density over the lateral right upper lung likely scarring/ atelectasis. New mild hazy opacification over the medial right base as cannot exclude infection. For mild biapical pleural thickening. No evidence of effusion. Cardiomediastinal silhouette and remainder of the exam is unchanged. IMPRESSION: Mild hazy opacification in the medial right base which may be due to infection. Recommend follow-up to resolution. Stable linear density over the right upper lung likely scarring/atelectasis. Electronically Signed   By: Marin Olp M.D.   On: 09/27/2015 20:36   Ct Chest W Contrast  09/28/2015  CLINICAL DATA:  Fall with hemoptysis. Initial encounter. EXAM: CT CHEST, ABDOMEN, AND PELVIS WITH CONTRAST TECHNIQUE:  Multidetector CT imaging of the chest, abdomen and pelvis was performed following the standard protocol during bolus administration of intravenous contrast. CONTRAST:  138mL OMNIPAQUE IOHEXOL 300 MG/ML  SOLN COMPARISON:  Abdominal CT 08/01/2015 FINDINGS: CT CHEST FINDINGS THORACIC INLET/BODY WALL: No acute abnormality. MEDIASTINUM: Normal heart size. No pericardial effusion. No acute vascular abnormality. Mediastinal and bilateral hilar (although greater on the right) lymphadenopathy with occasional central coarse calcification. LUNG WINDOWS: No typical contusion, hemothorax, or pneumothorax. There is a nodular opacity in the right upper lobe measuring 2 cm. Ill-defined airspace opacity over the central right diaphragm. Biapical pleural based thickening/scarring. OSSEOUS: See below CT ABDOMEN AND PELVIS FINDINGS BODY WALL: Interval left inguinal hernia repair with mild residual swelling. Hepatobiliary: No focal liver abnormality.No evidence of biliary obstruction or stone. Pancreas: Unremarkable. Spleen: Unremarkable. Adrenals/Urinary Tract: Negative adrenals. No evidence of renal injury. Unremarkable bladder. Reproductive:No pathologic findings. Stomach/Bowel:  No evidence of injury Vascular/Lymphatic: No acute vascular abnormality. No mass or adenopathy. Peritoneal: No ascites or pneumoperitoneum. Musculoskeletal: Right sixth, seventh, and eighth rib fractures with mild to moderate displacement. 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 IMPRESSION:  1. Right sixth through eighth rib fractures with up to moderate displacement. No underlying contusion to explain hemoptysis history. 2. Mediastinal and hilar lymphadenopathy with central calcifications suggesting granulomatous disease, including sarcoidosis. 3. Right basilar airspace disease and 2 cm right upper lobe nodule. Recommend pulmonary referral and short follow up CT to differentiate infectious/inflammatory opacity from neoplastic nodule. 4.  No evidence of acute intra-abdominal injury. 5. Bilateral femoral head avascular necrosis. Electronically Signed   By: Monte Fantasia M.D.   On: 09/28/2015 02:14   Ct Abdomen Pelvis W Contrast  09/28/2015  CLINICAL DATA:  Fall with hemoptysis. Initial encounter. EXAM: CT CHEST, ABDOMEN, AND PELVIS WITH CONTRAST TECHNIQUE: Multidetector CT imaging of the chest, abdomen and pelvis was performed following the standard protocol during bolus administration of intravenous contrast. CONTRAST:  184mL OMNIPAQUE IOHEXOL 300 MG/ML  SOLN COMPARISON:  Abdominal CT 08/01/2015 FINDINGS: CT CHEST FINDINGS THORACIC INLET/BODY WALL: No acute abnormality. MEDIASTINUM: Normal heart size. No pericardial effusion. No acute vascular abnormality. Mediastinal and bilateral hilar (although greater on the right) lymphadenopathy with occasional central coarse calcification. LUNG WINDOWS: No typical contusion, hemothorax, or pneumothorax. There is a nodular opacity in the right upper lobe measuring 2 cm. Ill-defined airspace opacity over the central right diaphragm. Biapical pleural based thickening/scarring. OSSEOUS: See below CT ABDOMEN AND PELVIS FINDINGS BODY WALL: Interval left inguinal hernia repair with mild residual swelling. Hepatobiliary: No focal liver abnormality.No evidence of biliary obstruction or stone. Pancreas: Unremarkable. Spleen: Unremarkable. Adrenals/Urinary Tract: Negative adrenals. No evidence of renal injury. Unremarkable bladder. Reproductive:No pathologic findings. Stomach/Bowel:  No evidence of injury Vascular/Lymphatic: No acute vascular abnormality. No mass or adenopathy. Peritoneal: No ascites or pneumoperitoneum. Musculoskeletal: Right sixth, seventh, and eighth rib fractures with mild to moderate displacement. 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 IMPRESSION: 1. Right sixth through eighth rib fractures with up to moderate displacement. No underlying contusion to explain hemoptysis  history. 2. Mediastinal and hilar lymphadenopathy with central calcifications suggesting granulomatous disease, including sarcoidosis. 3. Right basilar airspace disease and 2 cm right upper lobe nodule. Recommend pulmonary referral and short follow up CT to differentiate infectious/inflammatory opacity from neoplastic nodule. 4. No evidence of acute intra-abdominal injury. 5. Bilateral femoral head avascular necrosis. Electronically Signed   By: Monte Fantasia M.D.   On: 09/28/2015 02:14   I have personally reviewed and evaluated these images and lab results as part of my medical decision-making.   EKG Interpretation None      MDM   Final diagnoses:  Rib fracture, right, closed, initial encounter    Patient presents to the ED for back pain after a fall. He initially had hematuria and heomptysis which has now cleared up but the pain has worsened.  He has taken naproxen without pain relief.  CXR shows possible pneumonia, but he has no infectious history.  I am concerned this may actually be an injury as he has pain on the R side of his back as well.  He was given morphine for pain control.  Will order CT chest/abd/pel for evaluation.  CT scan reveals R rib fractures 6-8.  Also possible lung cancer.  Results were discussed with patient along with pain control and incentive spirometer.  PCP fu advised.  DC with oxycodone to take as needed.  He appears well and in NAD.  VS remain within his normal limits and he is safe for DC.   I personally performed the services described in this documentation, which was scribed in my presence. The recorded information  has been reviewed and is accurate.      Everlene Balls, MD 09/28/15 [id number]

## 2015-10-21 ENCOUNTER — Encounter: Payer: Self-pay | Admitting: Pulmonary Disease

## 2015-10-21 ENCOUNTER — Ambulatory Visit (INDEPENDENT_AMBULATORY_CARE_PROVIDER_SITE_OTHER): Payer: 59 | Admitting: Pulmonary Disease

## 2015-10-21 VITALS — BP 128/68 | HR 70 | Ht 69.0 in | Wt 151.4 lb

## 2015-10-21 DIAGNOSIS — R918 Other nonspecific abnormal finding of lung field: Secondary | ICD-10-CM

## 2015-10-21 NOTE — Progress Notes (Signed)
   Subjective:    Patient ID: Adam Gonzalez, male    DOB: [birth year], 55 y.o.   MRN: [id number]  HPI Follow-up for abnormal CT scan. Mr. Delarocha is a 55 year old with no significant past medical history. He fell at his work, about 15 feet from a dumpster last month. He had chest pain and hemoptysis for several days. He then went to the ED and had a CT of the chest abdomen pelvis which showed rib fractures, right upper lobe nodule, right lower lobe infiltrates and hilar, mediastinal lymphadenopathy. He has been referred here for further follow-up.   In the meantime he has followed up with his primary care physician. ACE levels are mildly elevated at 84. He feels well currently with no cough, sputum, hemoptysis, dyspnea, wheezing, rash, joint pain.  DATA: CT chest abd, pelvis 09/28/15 6-8 rib fractures. No contusion Mediastinal, hilar lymphadenopathy with central calcification Right lower lobe opacity, 2 cm right upper lobe nodule.  ACE 09/27/15- 84  Social History: Occasional smoker 2 packs a smoking history, marijuana use. No alcohol use. He works in [employer]  Family History: Noncontributory  Past Medical History  Diagnosis Date  . Medical history non-contributory      Current outpatient prescriptions:  .  aspirin 81 MG tablet, Take 81 mg by mouth daily., Disp: , Rfl:  .  oxyCODONE (OXY IR/ROXICODONE) 5 MG immediate release tablet, Take 1-2 tablets (5-10 mg total) by mouth 2 (two) times daily as needed for severe pain. (Patient not taking: Reported on 10/21/2015), Disp: 10 tablet, Rfl: 0  Review of Systems Denies any cough, sputum production, hemoptysis, dyspnea, wheezing. Denies any joint pain, rash, weakness, numbness, transient. Denies any chest pain, palpitation. Denies any nausea, vomiting, diarrhea, constipation. All other reviews of symptoms are negative    Objective:   Physical Exam Blood pressure 128/68, pulse 70, height 5\' 9"  (1.753 m), weight 151 lb 6.4 oz  (68.675 kg), SpO2 96 %. Gen: No apparent distress Neuro: No gross focal deficits. HEENT: No JVD, lymphadenopathy, thyromegaly. RS: Clear, No wheeze or crackles CVS: S1-S2 heard, no murmurs rubs gallops. Abdomen: Soft, positive bowel sounds. Musculoskeletal: No edema.    Assessment & Plan:  Abnormal CT with right upper lobe nodule, lymphadenopathy. This was discovered incidentally in the setting of a recent fall, hemoptysis. He is currently asymptomatic. We need to rule out malignancy, sarcoidosis. I will repeat the CT. If abnormalities persist and he may need a bronchoscope with biopsy.  Plan: - Repeat CT of chest.  Marshell Garfinkel MD [facility] Pager [phone] If no answer or after 3pm call: [phone] 10/21/2015, 11:52 AM

## 2015-10-21 NOTE — Patient Instructions (Signed)
We will schedule you for a follow CT of chest without contrast.  Follow up after CT to discuss findings and further treatment if needed.

## 2015-10-28 ENCOUNTER — Other Ambulatory Visit: Payer: Self-pay

## 2015-11-05 ENCOUNTER — Inpatient Hospital Stay: Admission: RE | Admit: 2015-11-05 | Payer: Self-pay | Source: Ambulatory Visit

## 2015-11-18 ENCOUNTER — Ambulatory Visit: Payer: Self-pay | Admitting: Pulmonary Disease

## 2016-10-28 IMAGING — CT CT ABD-PELV W/ CM
2 of 5 series · 16 of 46 positions shown, 18 images · IV contrast (Omni 300)
Comparison: None.

CLINICAL DATA: 54-year-old male with left-sided inguinal hernia

EXAM:
CT ABDOMEN AND PELVIS WITH CONTRAST
TECHNIQUE: Multidetector CT imaging of the abdomen and pelvis was performed
using the standard protocol following bolus administration of
intravenous contrast.
CONTRAST:  100mL OMNIPAQUE IOHEXOL 300 MG/ML  SOLN

[Series 2: a/p w/ 5mm · axial · 0.68mm/px · z∈[-487,-47]mm · 13 of 98 slices shown, 15 images]
[im 5/98  soft-tissue]
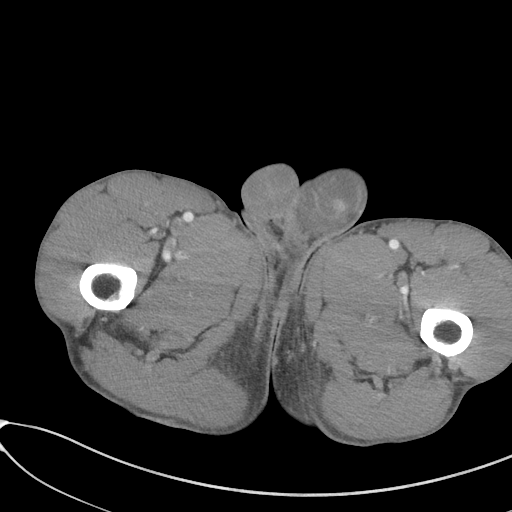
[im 5/98  bone]
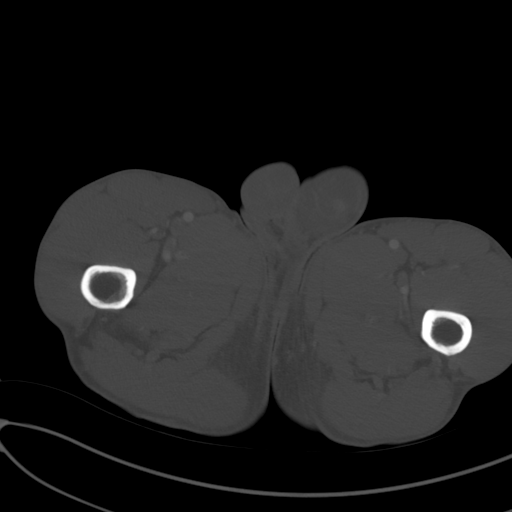
[im 15/98  soft-tissue]
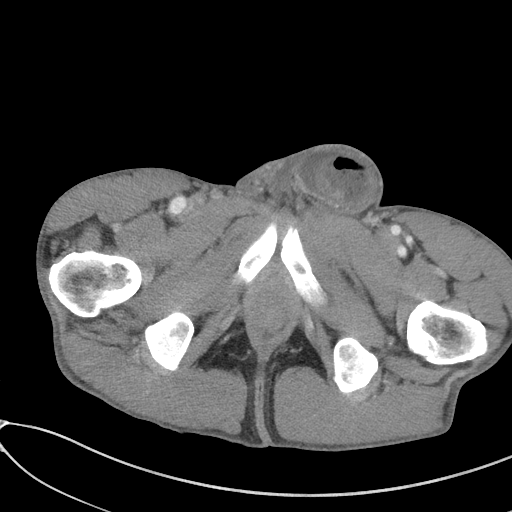
[im 20/98  soft-tissue]
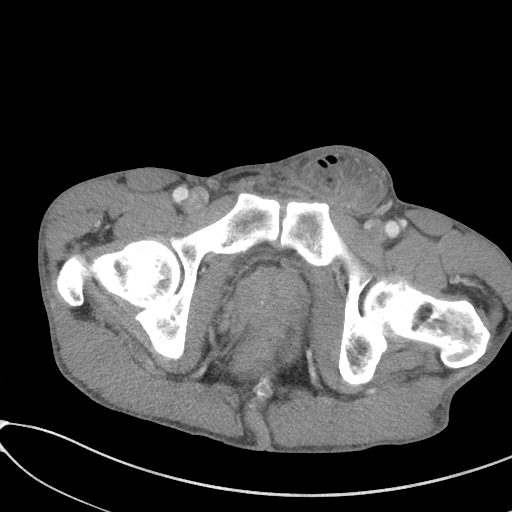
[im 30/98  soft-tissue]
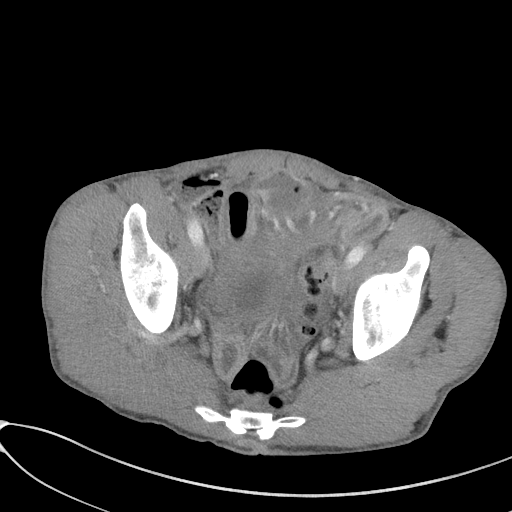
[im 34/98  soft-tissue]
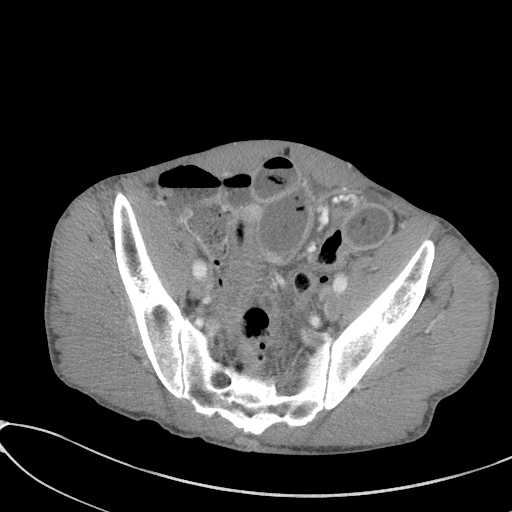
[im 44/98  soft-tissue]
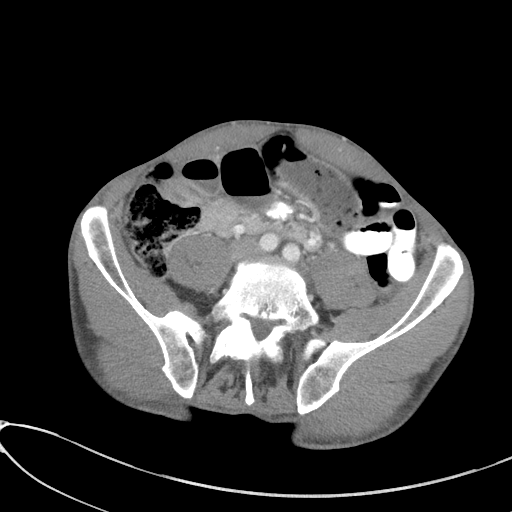
[im 49/98  soft-tissue]
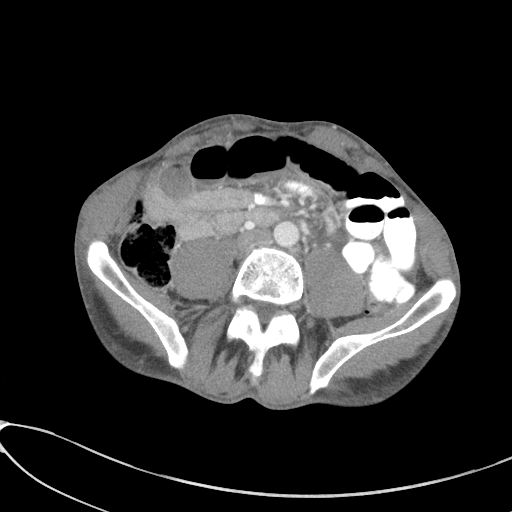
[im 54/98  soft-tissue]
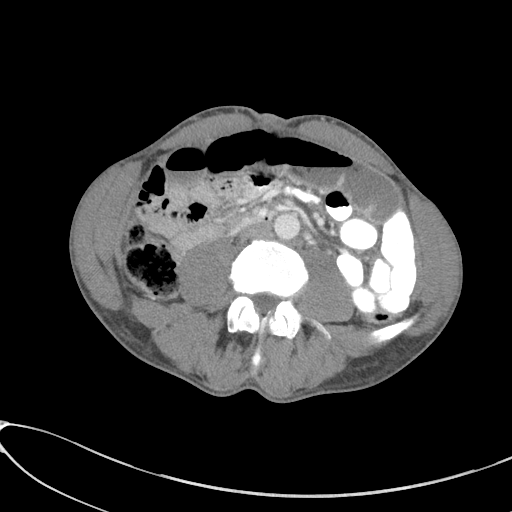
[im 64/98  soft-tissue]
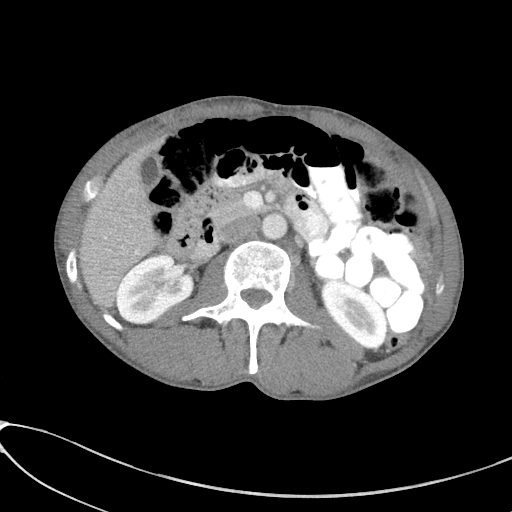
[im 64/98  bone]
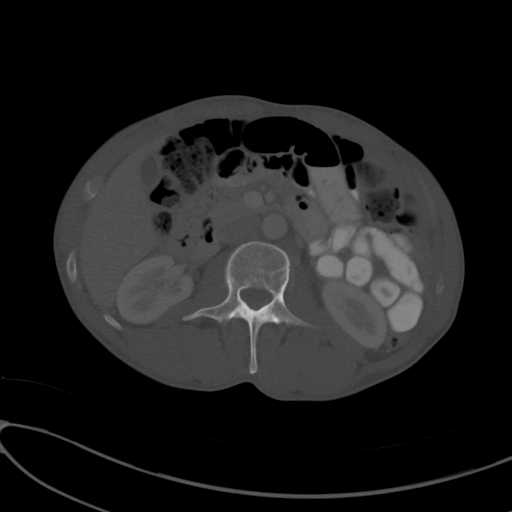
[im 68/98  soft-tissue]
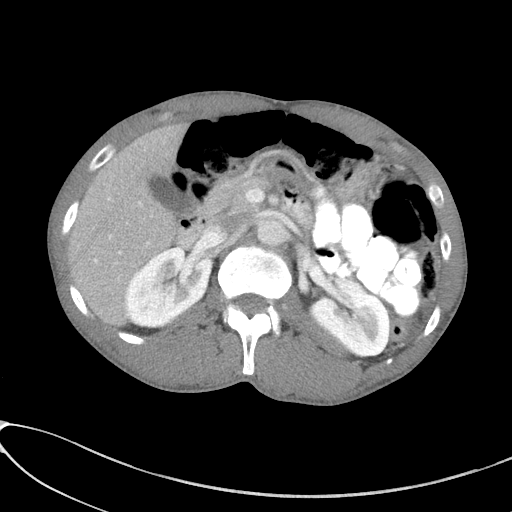
[im 78/98  soft-tissue]
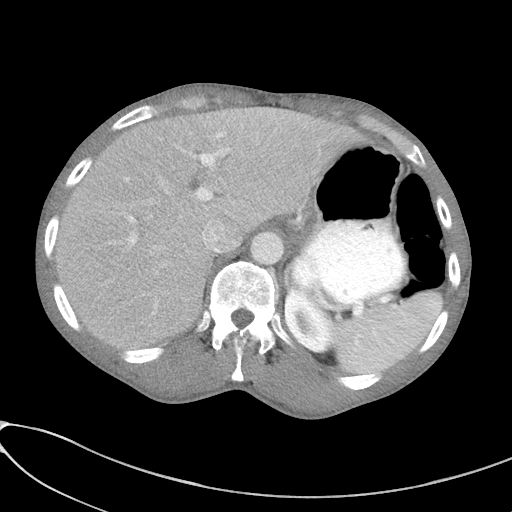
[im 83/98  soft-tissue]
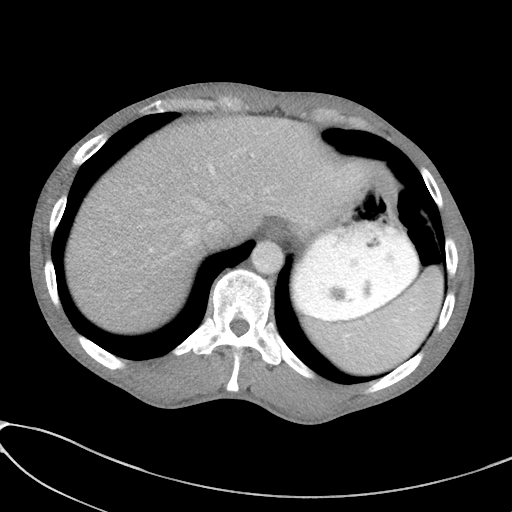
[im 93/98  soft-tissue]
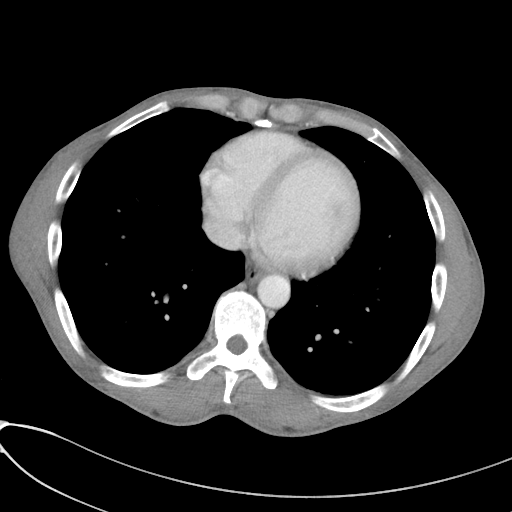

[Series 5: a/p w/ cor · coronal · 0.67mm/px · 3 of 109 slices shown]
[im 37/109  soft-tissue]
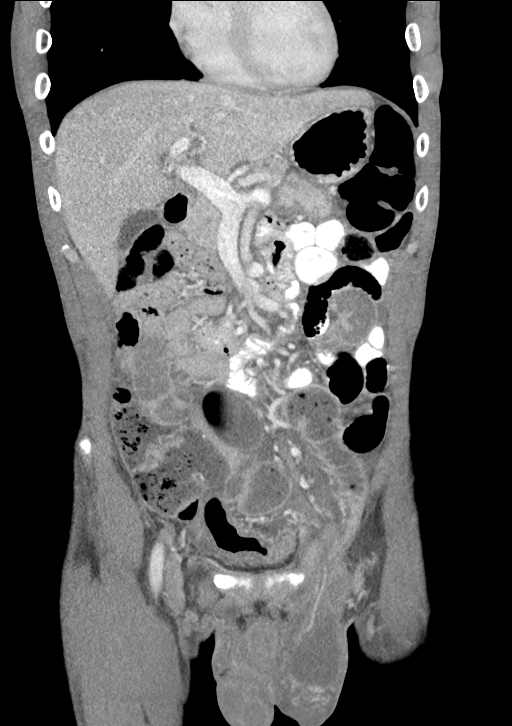
[im 49/109  soft-tissue]
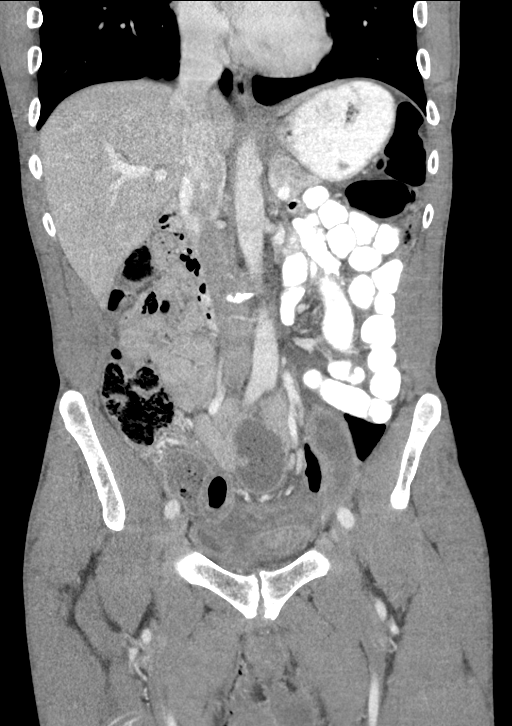
[im 61/109  soft-tissue]
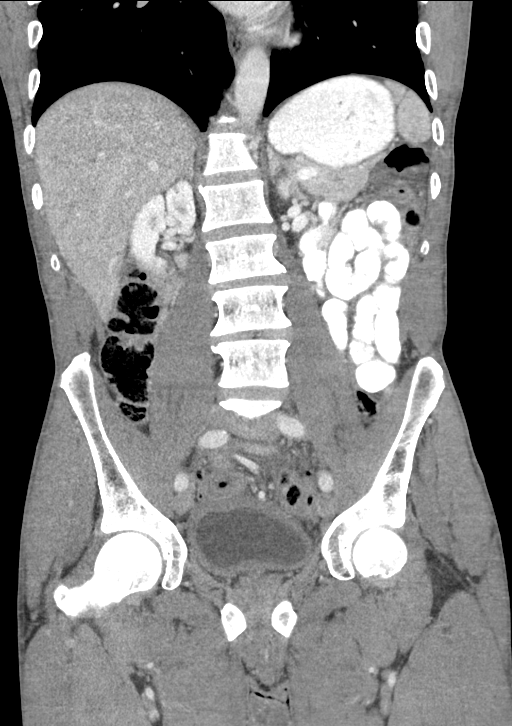

[16 of 46 positions shown; findings below may reference images not displayed]

FINDINGS: The visualized lung bases are clear. No intra-abdominal free air or
free fluid.

The liver, gallbladder, pancreas, spleen, adrenal glands, kidneys,
visualized ureters appear unremarkable. The there is apparent
diffuse thickening of the bladder wall which may be related to
inflammatory changes of the adjacent bowel. Cystitis is less likely
but not excluded. Correlation with urinalysis recommended. The
prostate gland is mildly enlarged measuring 4.5 cm in diameter.

There is a left inguinal hernia containing loops of small bowel.
There is narrowing of the bowel at the level of the hernia defect.
There is dilatation and inflammatory changes of the herniated loops
as well as loops of bowel proximal to the hernia compatible with
obstruction and strangulation. Small amount of fluid noted in the
hernia sac. No definite pneumatosis identified. Evaluation of the
colon is limited due to non opacification with oral contrast. The
appendix is unremarkable.

The abdominal aorta and IVC appear unremarkable. No portal venous
gas identified. There is no adenopathy. The osseous structures are
intact. There is evidence of avascular necrosis of the femoral heads
bilaterally.
IMPRESSION: Left inguinal hernia containing loops of small bowel with evidence
of obstruction and strangulation/incarceration. There is no
pneumatosis or portal venous gas. Clinical correlation and surgical
consult is advised.

## 2016-11-12 IMAGING — US US SCROTUM
1 series · 13 of 25 positions shown · non-contrast
Comparison: Pelvic CT 08/01/2015.

CLINICAL DATA: Left scrotal swelling status post left inguinal
hernia repair. Question hematoma.

EXAM:
ULTRASOUND OF SCROTUM
TECHNIQUE: Complete ultrasound examination of the testicles, epididymis, and
other scrotal structures was performed.

[Series 1: us scrotum · 0.08mm/px · 13 of 61 slices shown]
[im 1/61]
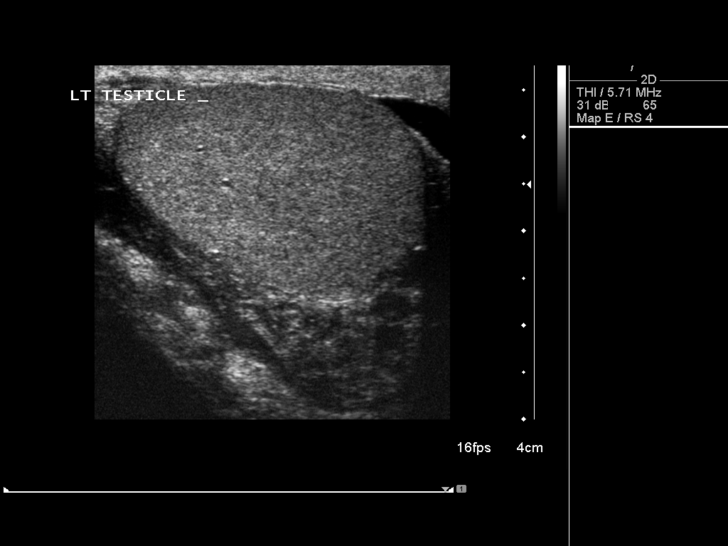
[im 6/61]
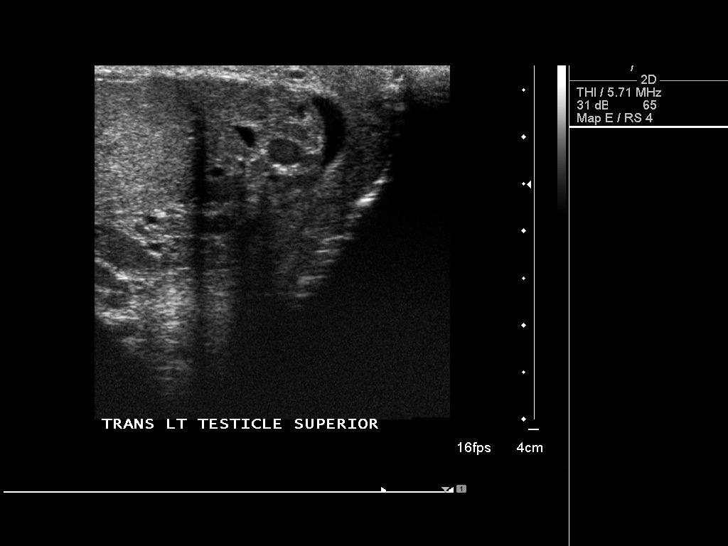
[im 11/61]
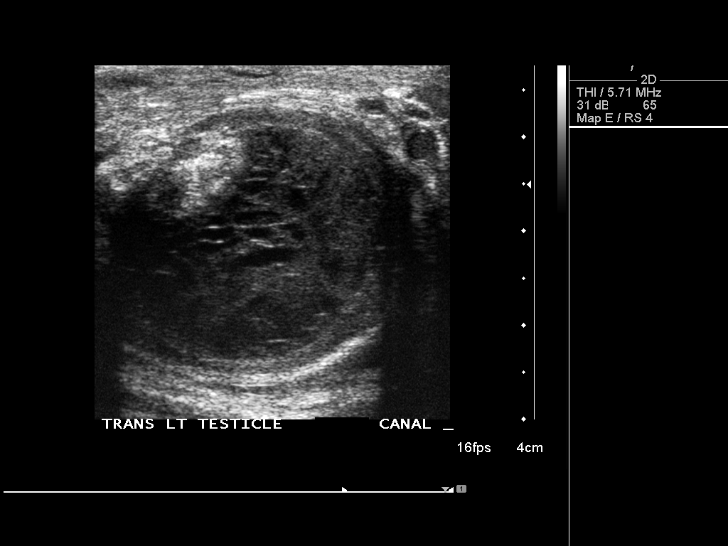
[im 16/61]
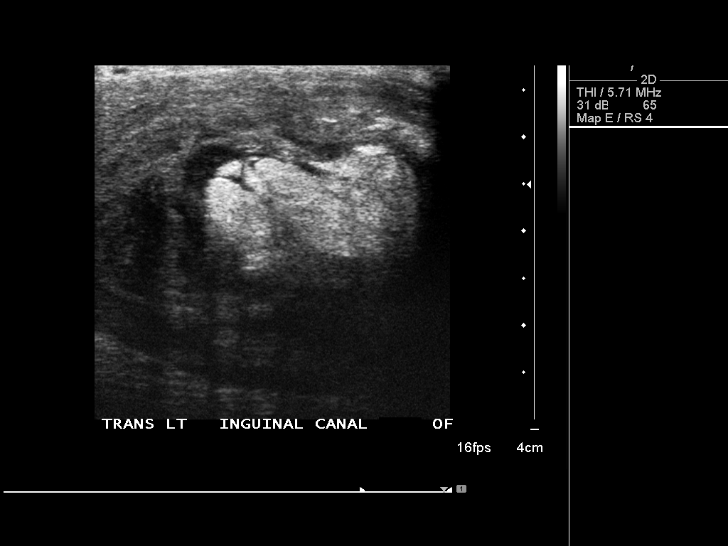
[im 21/61]
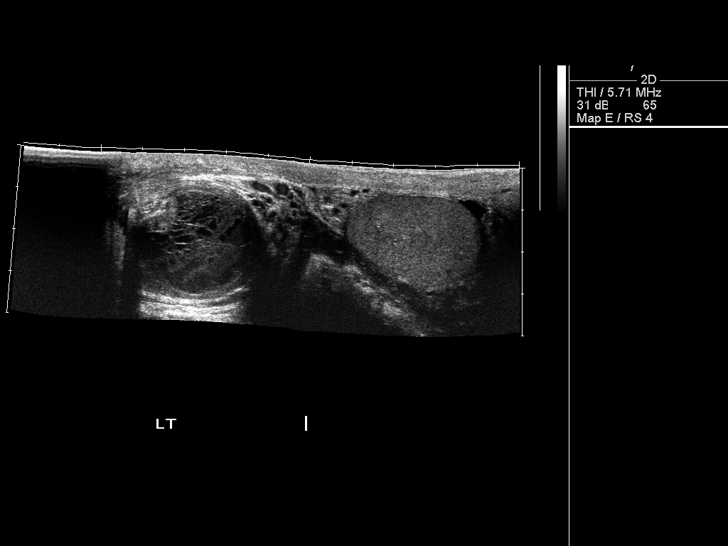
[im 26/61]
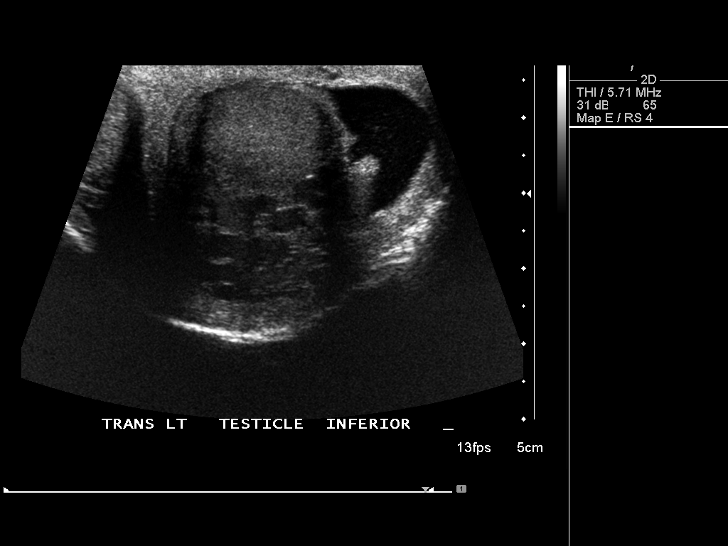
[im 31/61]
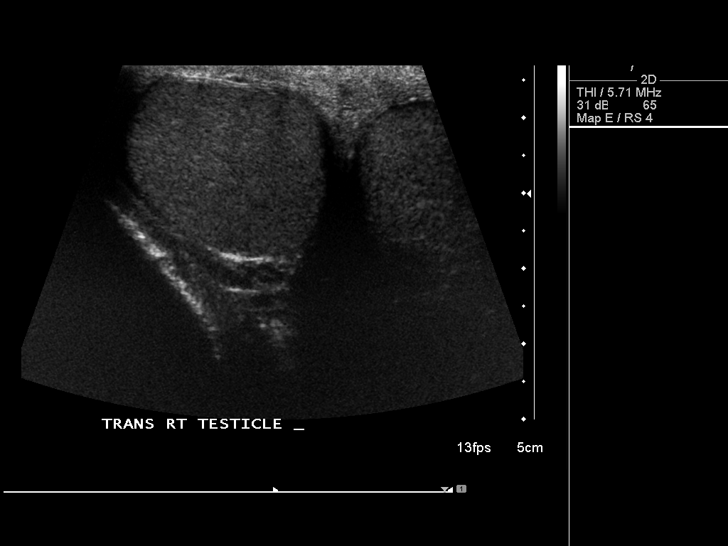
[im 36/61]
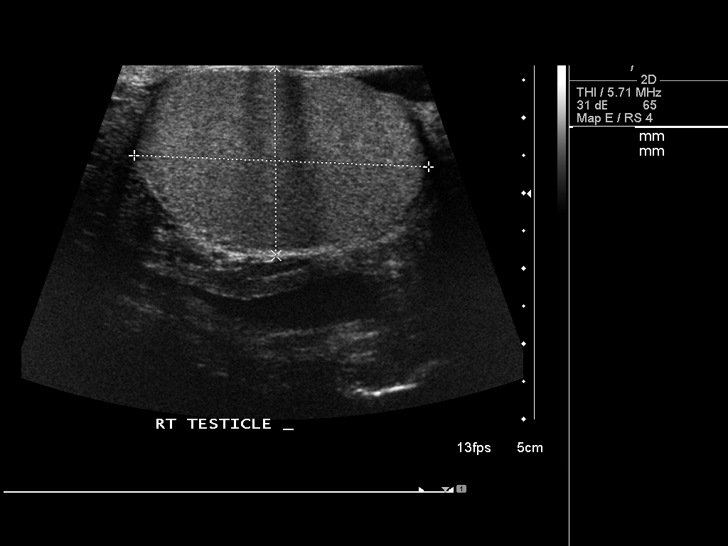
[im 41/61]
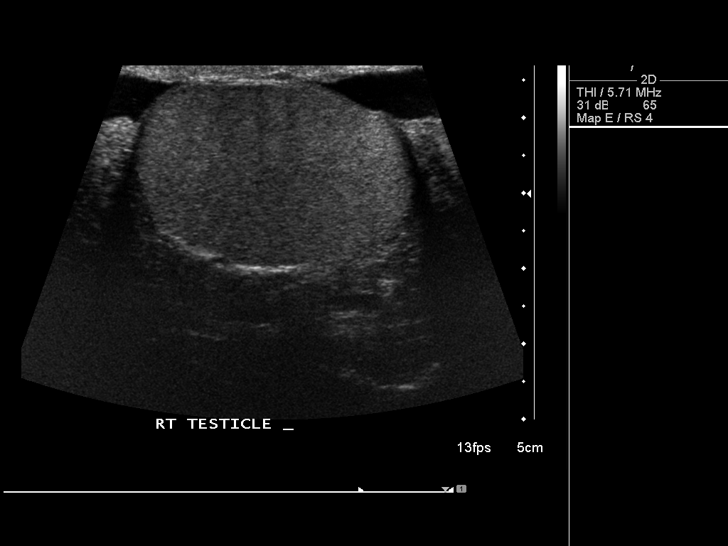
[im 46/61]
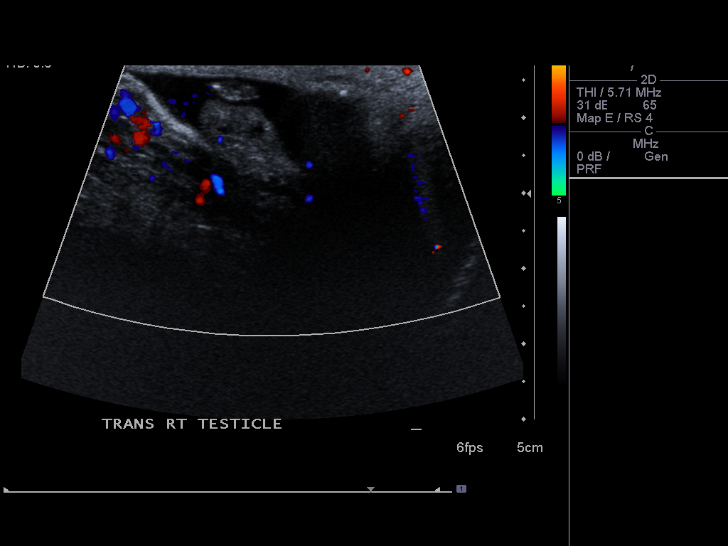
[im 51/61]
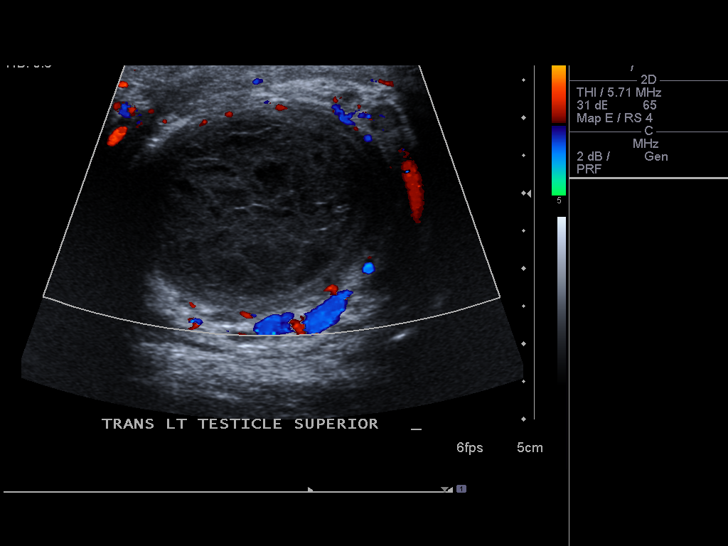
[im 56/61]
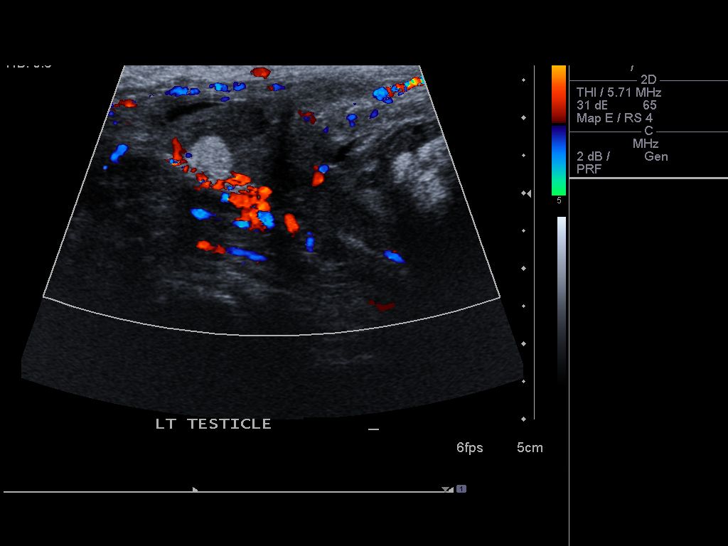
[im 61/61]
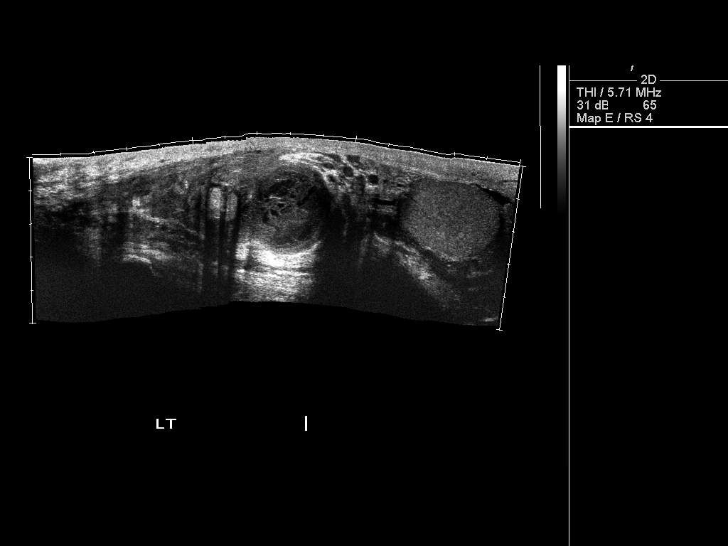

[13 of 25 positions shown; findings below may reference images not displayed]

FINDINGS: Right testicle

Measurements: 3.9 x 2.5 x 2.6 cm. No mass or microlithiasis
visualized.

Left testicle

Measurements: 3.4 x 2.1 x 2.7 cm. No intratesticular mass or
microlithiasis visualized. There is a complex mass superiorly in the
left inguinal canal which is separate from the testis. This measures
3.4 x 2.7 x 2.9 cm and has complex lace-like reticular and echogenic
components. There is no central blood flow within this lesion,
although there is peripheral vascularity, in part due to a
varicocele.

Right epididymis:  Normal in size and appearance.

Left epididymis:  Normal in size and appearance.

Hydrocele:  Small right-sided hydrocele.

Varicocele: There are mildly dilated vessels superiorly in the left
inguinal canal.
IMPRESSION: 1. Complex mass superiorly in the left inguinal canal has lace-like
reticular and echogenic components, suggesting a retracting
hematoma. Adjacent small varicocele.
2. Residual/recurrent bowel herniation cannot be excluded by this
examination. Follow up CT recommended if that is a clinical concern.
3. Both testes appear normal.

## 2017-08-28 ENCOUNTER — Emergency Department (HOSPITAL_COMMUNITY)
Admission: EM | Admit: 2017-08-28 | Discharge: 2017-08-28 | Disposition: A | Discharge status: Home / Self Care | Payer: Self-pay | Attending: Emergency Medicine | Admitting: Emergency Medicine

## 2017-08-28 ENCOUNTER — Encounter (HOSPITAL_COMMUNITY): Payer: Self-pay

## 2017-08-28 ENCOUNTER — Emergency Department (HOSPITAL_COMMUNITY): Payer: Self-pay

## 2017-08-28 DIAGNOSIS — F17210 Nicotine dependence, cigarettes, uncomplicated: Secondary | ICD-10-CM | POA: Insufficient documentation

## 2017-08-28 DIAGNOSIS — Z7982 Long term (current) use of aspirin: Secondary | ICD-10-CM | POA: Insufficient documentation

## 2017-08-28 DIAGNOSIS — J111 Influenza due to unidentified influenza virus with other respiratory manifestations: Secondary | ICD-10-CM | POA: Insufficient documentation

## 2017-08-28 LAB — RAPID STREP SCREEN (MED CTR MEBANE ONLY): Streptococcus, Group A Screen (Direct): NEGATIVE

## 2017-08-28 LAB — INFLUENZA PANEL BY PCR (TYPE A & B)
Influenza A By PCR: POSITIVE — AB
Influenza B By PCR: NEGATIVE

## 2017-08-28 MED ORDER — OSELTAMIVIR PHOSPHATE 75 MG PO CAPS: 75.0000 mg | ORAL_CAPSULE | Freq: Two times a day (BID) | ORAL | 0 refills | Status: DC

## 2017-08-28 NOTE — ED Triage Notes (Signed)
Pt presents with 2 day h/o productive cough with yellow phlegm, reported fever at home.  + nasal congestion and sore throat.

## 2017-08-28 NOTE — ED Provider Notes (Signed)
[facility] Provider Note   CSN: [id number] Arrival date & time: 08/28/17  1732     History   Chief Complaint No chief complaint on file.   HPI Adam [facility] is a 57 y.o. male.    HPI  57 year old male presents today with complaints of upper respiratory infection.  Patient notes body aches, fever and chills, cough.  She notes symptoms started approximately 2 days ago.  Patient denies any respiratory distress.  He ports taking aspirin this morning, none prior to arrival.  Patient reports that he is not a smoker, does not have any pulmonary disease, and has no significant past medical history does not take any daily medications.  Patient denies chest pain.  Past Medical History:  Diagnosis Date  . Medical history non-contributory     Patient Active Problem List   Diagnosis Date Noted  . Incarcerated left inguinal hernia 08/02/2015  . Left inguinal hernia 02/19/2014  . Inguinal hernia 12/17/2013  . Other malaise and fatigue 12/17/2013  . Nasal congestion 12/17/2013  . Encounter for screening colonoscopy 12/17/2013    Past Surgical History:  Procedure Laterality Date  . INGUINAL HERNIA REPAIR Right   . INGUINAL HERNIA REPAIR Left 08/02/2015  . INGUINAL HERNIA REPAIR Left 08/02/2015   Procedure: HERNIA REPAIR INGUINAL ADULT;  Surgeon: Mickeal Skinner, MD;  Location: [place];  Service: General;  Laterality: Left;  . NASAL SINUS SURGERY         Home Medications    Prior to Admission medications   Medication Sig Start Date End Date Taking? Authorizing Provider  aspirin 81 MG tablet Take 81 mg by mouth daily.    [provider]  oseltamivir (TAMIFLU) 75 MG capsule Take 1 capsule (75 mg total) by mouth every 12 (twelve) hours. 08/28/17   Viren Lebeau, Dellis Filbert, PA-C  oxyCODONE (OXY IR/ROXICODONE) 5 MG immediate release tablet Take 1-2 tablets (5-10 mg total) by mouth 2 (two) times daily as needed for severe pain. Patient not  taking: Reported on 10/21/2015 09/28/15   Everlene Balls, MD    Family History History reviewed. No pertinent family history.  Social History Social History   Tobacco Use  . Smoking status: Current Some Day Smoker    Packs/day: 0.25    Years: 8.00    Pack years: 2.00    Types: Cigars, Cigarettes  . Smokeless tobacco: Never Used  Substance Use Topics  . Alcohol use: Yes    Alcohol/week: 1.8 oz    Types: 3 Cans of beer per week  . Drug use: Yes    Comment: Smoked marjuana 3-4 yrs. ago     Allergies   Patient has no known allergies.   Review of Systems Review of Systems  All other systems reviewed and are negative.    Physical Exam Updated Vital Signs BP 112/73 (BP Location: Right Arm)   Pulse 79   Temp 99.5 F (37.5 C) (Oral)   Resp 16   Ht 6' (1.829 m)   Wt 74.8 kg (165 lb)   SpO2 99%   BMI 22.38 kg/m   Physical Exam  Constitutional: He is oriented to person, place, and time. He appears well-developed and well-nourished.  HENT:  Head: Normocephalic and atraumatic.  Rhinorrhea  Eyes: Conjunctivae are normal. Pupils are equal, round, and reactive to light. Right eye exhibits no discharge. Left eye exhibits no discharge. No scleral icterus.  Neck: Normal range of motion. No JVD present. No tracheal deviation present.  Cardiovascular: Normal  rate, regular rhythm and normal heart sounds.  Pulmonary/Chest: Effort normal and breath sounds normal. No stridor. No respiratory distress. He has no wheezes. He has no rales. He exhibits no tenderness.  Neurological: He is alert and oriented to person, place, and time. Coordination normal.  Psychiatric: He has a normal mood and affect. His behavior is normal. Judgment and thought content normal.  Nursing note and vitals reviewed.    ED Treatments / Results  Labs (all labs ordered are listed, but only abnormal results are displayed) Labs Reviewed  INFLUENZA PANEL BY PCR (TYPE A & B) - Abnormal; Notable for the following  components:      Result Value   Influenza A By PCR POSITIVE (*)    All other components within normal limits  RAPID STREP SCREEN (NOT AT [facility])  CULTURE, GROUP A STREP [facility])    EKG  EKG Interpretation None       Radiology Dg Chest 2 View  Result Date: 08/28/2017 CLINICAL DATA:  Productive cough, fever EXAM: CHEST  2 VIEW COMPARISON:  09/27/2015 FINDINGS: Scarring in the apices, right greater than left. Heart is normal size. No acute airspace opacities or effusions. Old right rib fractures. No acute bony abnormality. IMPRESSION: Chronic changes.  No active disease. Electronically Signed   By: Rolm Baptise M.D.   On: 08/28/2017 18:02    Procedures Procedures (including critical care time)  Medications Ordered in ED Medications - No data to display   Initial Impression / Assessment and Plan / ED Course  I have reviewed the triage vital signs and the nursing notes.  Pertinent labs & imaging results that were available during my care of the patient were reviewed by me and considered in my medical decision making (see chart for details).      Final Clinical Impressions(s) / ED Diagnoses   Final diagnoses:  Influenza    57 year old male presents today with influenza.  Patient does not have any significant past medical history and no risk factors for poor outcome.  I discussed this with patient and option of treatment, patient would not like any treatment.  Patient has clear lung sounds and negative chest x-ray with reassuring vital signs here.  I had a extensive conversation with him about returning immediately if any new or worsening signs or symptoms.  Patient verbalized understanding and agreement to today's plan had no further questions or concerns at the time of discharge.  ED Discharge Orders        Ordered    oseltamivir (TAMIFLU) 75 MG capsule  Every 12 hours     08/28/17 2107       Okey Regal, Hershal Coria 08/28/17 2202    Blanchie Dessert, MD 08/28/17 2206

## 2017-08-28 NOTE — Discharge Instructions (Signed)
Please read attached information. If you experience any new or worsening signs or symptoms please return to the emergency room for evaluation. Please follow-up with your primary care provider or specialist as discussed. Please use medication prescribed only as directed and discontinue taking if you have any concerning signs or symptoms.   °

## 2017-08-31 LAB — CULTURE, GROUP A STREP (THRC)

## 2017-09-27 ENCOUNTER — Encounter (HOSPITAL_COMMUNITY): Payer: Self-pay | Admitting: Emergency Medicine

## 2017-09-27 ENCOUNTER — Emergency Department (HOSPITAL_COMMUNITY)
Admission: EM | Admit: 2017-09-27 | Discharge: 2017-09-27 | Disposition: A | Discharge status: Home / Self Care | Payer: Worker's Compensation | Attending: Emergency Medicine | Admitting: Emergency Medicine

## 2017-09-27 ENCOUNTER — Other Ambulatory Visit: Payer: Self-pay

## 2017-09-27 ENCOUNTER — Emergency Department (HOSPITAL_COMMUNITY): Payer: Worker's Compensation

## 2017-09-27 DIAGNOSIS — Z7982 Long term (current) use of aspirin: Secondary | ICD-10-CM | POA: Insufficient documentation

## 2017-09-27 DIAGNOSIS — M549 Dorsalgia, unspecified: Secondary | ICD-10-CM | POA: Insufficient documentation

## 2017-09-27 DIAGNOSIS — F17210 Nicotine dependence, cigarettes, uncomplicated: Secondary | ICD-10-CM | POA: Diagnosis not present

## 2017-09-27 DIAGNOSIS — Y9289 Other specified places as the place of occurrence of the external cause: Secondary | ICD-10-CM | POA: Insufficient documentation

## 2017-09-27 DIAGNOSIS — Y990 Civilian activity done for income or pay: Secondary | ICD-10-CM | POA: Insufficient documentation

## 2017-09-27 DIAGNOSIS — W109XXA Fall (on) (from) unspecified stairs and steps, initial encounter: Secondary | ICD-10-CM | POA: Insufficient documentation

## 2017-09-27 DIAGNOSIS — S199XXA Unspecified injury of neck, initial encounter: Secondary | ICD-10-CM | POA: Diagnosis present

## 2017-09-27 DIAGNOSIS — Y9389 Activity, other specified: Secondary | ICD-10-CM | POA: Diagnosis not present

## 2017-09-27 DIAGNOSIS — W19XXXA Unspecified fall, initial encounter: Secondary | ICD-10-CM

## 2017-09-27 DIAGNOSIS — S161XXA Strain of muscle, fascia and tendon at neck level, initial encounter: Secondary | ICD-10-CM | POA: Diagnosis not present

## 2017-09-27 MED ORDER — HYDROCODONE-ACETAMINOPHEN 5-325 MG PO TABS
2.0000 | ORAL_TABLET | Freq: Once | ORAL | Status: AC
Start: 2017-09-27 — End: 2017-09-27
  Administered 2017-09-27: 2 via ORAL
  Filled 2017-09-27: qty 2

## 2017-09-27 MED ORDER — HYDROCODONE-ACETAMINOPHEN 5-325 MG PO TABS: 1.0000 | ORAL_TABLET | Freq: Four times a day (QID) | ORAL | 0 refills | Status: DC | PRN

## 2017-09-27 NOTE — ED Provider Notes (Signed)
[place] EMERGENCY DEPARTMENT Provider Note   CSN: [id number] Arrival date & time: 09/27/17  1448     History   Chief Complaint Chief Complaint  Patient presents with  . Fall    HPI Adam [facility] is a 57 y.o. male.  Patient fell down approximately 12 steps immediately prior to coming here while at work.  He was carrying a sofa.  He complains of neck stiffness and diffuse back stiffness since the event.  No loss of bladder bowel control.  He is been Dealer since the event.  Denies abdominal pain denies chest pain denies shortness of breath.  No treatment prior to coming here.  Nothing makes symptoms better or worse.  HPI  Past Medical History:  Diagnosis Date  . Medical history non-contributory     Patient Active Problem List   Diagnosis Date Noted  . Incarcerated left inguinal hernia 08/02/2015  . Left inguinal hernia 02/19/2014  . Inguinal hernia 12/17/2013  . Other malaise and fatigue 12/17/2013  . Nasal congestion 12/17/2013  . Encounter for screening colonoscopy 12/17/2013    Past Surgical History:  Procedure Laterality Date  . INGUINAL HERNIA REPAIR Right   . INGUINAL HERNIA REPAIR Left 08/02/2015  . INGUINAL HERNIA REPAIR Left 08/02/2015   Procedure: HERNIA REPAIR INGUINAL ADULT;  Surgeon: Mickeal Skinner, MD;  Location: [facility];  Service: General;  Laterality: Left;  . NASAL SINUS SURGERY         Home Medications    Prior to Admission medications   Medication Sig Start Date End Date Taking? Authorizing Provider  aspirin 81 MG tablet Take 81 mg by mouth daily.    [provider]  HYDROcodone-acetaminophen (NORCO) 5-325 MG tablet Take 1-2 tablets by mouth every 6 (six) hours as needed for severe pain. 09/27/17   Orlie Dakin, MD  oseltamivir (TAMIFLU) 75 MG capsule Take 1 capsule (75 mg total) by mouth every 12 (twelve) hours. 08/28/17   Hedges, Dellis Filbert, PA-C  oxyCODONE (OXY IR/ROXICODONE) 5 MG immediate release tablet Take  1-2 tablets (5-10 mg total) by mouth 2 (two) times daily as needed for severe pain. Patient not taking: Reported on 10/21/2015 09/28/15   Everlene Balls, MD    Family History No family history on file.  Social History Social History   Tobacco Use  . Smoking status: Current Some Day Smoker    Packs/day: 0.25    Years: 8.00    Pack years: 2.00    Types: Cigars, Cigarettes  . Smokeless tobacco: Never Used  Substance Use Topics  . Alcohol use: Yes    Alcohol/week: 1.8 oz    Types: 3 Cans of beer per week  . Drug use: Yes    Comment: Smoked marjuana 3-4 yrs. ago     Allergies   Patient has no known allergies.   Review of Systems Review of Systems  Constitutional: Negative.   HENT: Negative.   Respiratory: Negative.   Cardiovascular: Positive for chest pain.       Syncope  Gastrointestinal: Negative.   Musculoskeletal: Positive for back pain and neck stiffness.  Skin: Negative.   Allergic/Immunologic: Negative.   Neurological: Negative.   Psychiatric/Behavioral: Negative.   All other systems reviewed and are negative.    Physical Exam Updated Vital Signs BP 122/84   Pulse 80   Temp 97.9 F (36.6 C) (Oral)   Resp 18   SpO2 98%   Physical Exam  Constitutional: He is oriented to person, place, and time. He  appears well-developed and well-nourished.  HENT:  Head: Normocephalic and atraumatic.  Eyes: Conjunctivae are normal. Pupils are equal, round, and reactive to light.  Neck: Neck supple. No tracheal deviation present. No thyromegaly present.  Cardiovascular: Normal rate and regular rhythm.  No murmur heard. Pulmonary/Chest: Effort normal and breath sounds normal.  Abdominal: Soft. Bowel sounds are normal. He exhibits no distension. There is no tenderness.  Musculoskeletal: Normal range of motion. He exhibits no edema or tenderness.  Tired spine nontender.  Pelvis stable nontender.  All 4 extremities without contusion abrasion or tenderness neurovascular intact    Neurological: He is alert and oriented to person, place, and time. Coordination normal.  Gait normal motor strength 5/5 overall  Skin: Skin is warm and dry. No rash noted.  Psychiatric: He has a normal mood and affect.  Nursing note and vitals reviewed.    ED Treatments / Results  Labs (all labs ordered are listed, but only abnormal results are displayed) Labs Reviewed - No data to display  EKG  EKG Interpretation None       Radiology Dg Cervical Spine Complete  Result Date: 09/27/2017 CLINICAL DATA:  Neck pain after fall. EXAM: CERVICAL SPINE - COMPLETE 4+ VIEW COMPARISON:  None. FINDINGS: The lateral view is diagnostic to the C7-T1 level. There is no acute fracture or subluxation. Vertebral body heights are preserved. Alignment is normal. Multilevel moderate disc height loss and facet uncovertebral hypertrophy throughout the cervical spine. Mild bilateral neuroforaminal stenosis at C5-C6 and C6-C7. Mild neuroforaminal stenosis on the right at C4-C5 and on the left at C3-C4. Normal prevertebral soft tissues. IMPRESSION: 1.  No acute osseous abnormality. 2. 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 Electronically Signed   By: Titus Dubin M.D.   On: 09/27/2017 19:03   Dg Thoracic Spine 2 View  Result Date: 09/27/2017 CLINICAL DATA:  57 year old male status post blunt trauma, fell down stairs while moving furniture. Thoracolumbar back pain. EXAM: THORACIC SPINE 2 VIEWS COMPARISON:  Chest radiographs 08/28/2017. Chest abdomen and pelvis CT 09/28/2015. FINDINGS: Normal thoracic segmentation. Chronic levoconvex scoliosis in the upper thoracic spine. Cervicothoracic junction alignment is within normal limits. Preserved thoracic kyphosis. Stable thoracic vertebral height and alignment. Relatively preserved disc spaces. Stable visible lung parenchyma with chronic scarring in the right upper lobe. Stable visible posterior ribs.  The visible upper lumbar levels appear intact. IMPRESSION: No acute osseous abnormality in the thoracic spine. Electronically Signed   By: Genevie Ann M.D.   On: 09/27/2017 16:30   Dg Lumbar Spine Complete  Result Date: 09/27/2017 CLINICAL DATA:  57 year old male status post blunt trauma, fell down stairs while moving furniture. Thoracolumbar back pain. EXAM: LUMBAR SPINE - COMPLETE 4+ VIEW COMPARISON:  Thoracic spine radiographs today. CT Abdomen and Pelvis 09/28/2015. FINDINGS: Normal lumbar segmentation. Stable and normal lumbar vertebral height and alignment. Mild lumbar endplate spurring is stable and associated with mild chronic disc space loss except at L1-L2. no pars fracture. The sacrum and visible lower thoracic levels appear intact. Negative visible bowel gas pattern. IMPRESSION: No acute osseous abnormality in lumbar spine. Electronically Signed   By: Genevie Ann M.D.   On: 09/27/2017 16:32    Procedures Procedures (including critical care time)  Medications Ordered in ED Medications  HYDROcodone-acetaminophen (NORCO/VICODIN) 5-325 MG per tablet 2 tablet (2 tablets Oral Given 09/27/17 2027)     Initial Impression / Assessment and Plan / ED Course  I have reviewed the triage vital signs and  the nursing notes.  Pertinent labs & imaging results that were available during my care of the patient were reviewed by me and considered in my medical decision making (see chart for details).     X-rays viewed by me.  Plan prescription Norco.  Referral primary care or urgent care.[place] Controlled Substance reporting System queried  Final Clinical Impressions(s) / ED Diagnoses  Diagnosis #1 fall #2 back pain #3 cervical strain Final diagnoses:  Fall, initial encounter    ED Discharge Orders        Ordered    HYDROcodone-acetaminophen (NORCO) 5-325 MG tablet  Every 6 hours PRN     09/27/17 2100       Orlie Dakin, MD 09/27/17 2105

## 2017-09-27 NOTE — ED Triage Notes (Signed)
Patient complains of lower back pain after falling down several stairs while trying to carry a dresser down a flight of steps. Patient denies LOC.

## 2017-09-27 NOTE — Discharge Instructions (Signed)
Pain medicine prescribed for bad pain or Advil for mild pain.  See an urgent care center or ask if you have a workers comp clinic if having significant pain by next week.  Return if concern for any reason

## 2017-09-27 NOTE — ED Provider Notes (Signed)
Patient placed in Quick Look pathway, seen and evaluated   Chief Complaint: neck and back pain  HPI:   Pt fell down a flight of stairs.  Pt hit on his back.    JSR:PRXYVO of Systems  Musculoskeletal: Positive for back pain and neck pain.  Neurological: Negative for headaches.    Physical Exam:   Gen: No distress  Neuro: Awake and Alert  Skin: Warm    Focused Exam: tender cervical, thoracic and lumbar spine,    Initiation of care has begun. The patient has been counseled on the process, plan, and necessity for staying for the completion/evaluation, and the remainder of the medical screening examination   Sidney Ace 09/27/17 1516    Charlesetta Shanks, MD 10/04/17 [phone]

## 2017-09-30 ENCOUNTER — Encounter (HOSPITAL_COMMUNITY): Payer: Self-pay | Admitting: Emergency Medicine

## 2017-09-30 ENCOUNTER — Emergency Department (HOSPITAL_COMMUNITY): Payer: Worker's Compensation

## 2017-09-30 ENCOUNTER — Other Ambulatory Visit: Payer: Self-pay

## 2017-09-30 ENCOUNTER — Emergency Department (HOSPITAL_COMMUNITY)
Admission: EM | Admit: 2017-09-30 | Discharge: 2017-09-30 | Disposition: A | Discharge status: Home / Self Care | Payer: Worker's Compensation | Attending: Emergency Medicine | Admitting: Emergency Medicine

## 2017-09-30 DIAGNOSIS — W228XXA Striking against or struck by other objects, initial encounter: Secondary | ICD-10-CM | POA: Insufficient documentation

## 2017-09-30 DIAGNOSIS — W19XXXA Unspecified fall, initial encounter: Secondary | ICD-10-CM

## 2017-09-30 DIAGNOSIS — R1010 Upper abdominal pain, unspecified: Secondary | ICD-10-CM | POA: Insufficient documentation

## 2017-09-30 DIAGNOSIS — F17290 Nicotine dependence, other tobacco product, uncomplicated: Secondary | ICD-10-CM | POA: Diagnosis not present

## 2017-09-30 DIAGNOSIS — Z7982 Long term (current) use of aspirin: Secondary | ICD-10-CM | POA: Insufficient documentation

## 2017-09-30 LAB — CBC
HEMATOCRIT: 41.3 % (ref 39.0–52.0)
Hemoglobin: 13.4 g/dL (ref 13.0–17.0)
MCH: 29.3 pg (ref 26.0–34.0)
MCHC: 32.4 g/dL (ref 30.0–36.0)
MCV: 90.2 fL (ref 78.0–100.0)
Platelets: 243 10*3/uL (ref 150–400)
RBC: 4.58 MIL/uL (ref 4.22–5.81)
RDW: 13.5 % (ref 11.5–15.5)
WBC: 12 10*3/uL — ABNORMAL HIGH (ref 4.0–10.5)

## 2017-09-30 LAB — COMPREHENSIVE METABOLIC PANEL
ALBUMIN: 3.9 g/dL (ref 3.5–5.0)
ALT: 19 U/L (ref 17–63)
AST: 27 U/L (ref 15–41)
Alkaline Phosphatase: 52 U/L (ref 38–126)
Anion gap: 11 (ref 5–15)
BILIRUBIN TOTAL: 0.8 mg/dL (ref 0.3–1.2)
BUN: 15 mg/dL (ref 6–20)
CHLORIDE: 105 mmol/L (ref 101–111)
CO2: 25 mmol/L (ref 22–32)
Calcium: 9.3 mg/dL (ref 8.9–10.3)
Creatinine, Ser: 0.71 mg/dL (ref 0.61–1.24)
GFR calc Af Amer: >60 mL/min (ref >60)
GFR calc non Af Amer: >60 mL/min (ref >60)
GLUCOSE: 128 mg/dL — AB (ref 65–99)
POTASSIUM: 4 mmol/L (ref 3.5–5.1)
SODIUM: 141 mmol/L (ref 135–145)
Total Protein: 7.2 g/dL (ref 6.5–8.1)

## 2017-09-30 MED ORDER — HYDROMORPHONE HCL 1 MG/ML IJ SOLN
0.5000 mg | Freq: Once | INTRAMUSCULAR | Status: AC
  Administered 2017-09-30: 0.5 mg via INTRAVENOUS
  Filled 2017-09-30: qty 1

## 2017-09-30 MED ORDER — TRAMADOL HCL 50 MG PO TABS: 50.0000 mg | ORAL_TABLET | Freq: Four times a day (QID) | ORAL | 0 refills | Status: DC | PRN

## 2017-09-30 MED ORDER — IOPAMIDOL (ISOVUE-300) INJECTION 61%
100.0000 mL | Freq: Once | INTRAVENOUS | Status: AC | PRN
  Administered 2017-09-30: 100 mL via INTRAVENOUS

## 2017-09-30 MED ORDER — ONDANSETRON HCL 4 MG/2ML IJ SOLN
4.0000 mg | Freq: Once | INTRAMUSCULAR | Status: AC
  Administered 2017-09-30: 4 mg via INTRAVENOUS
  Filled 2017-09-30: qty 2

## 2017-09-30 NOTE — ED Provider Notes (Signed)
[facility] EMERGENCY DEPARTMENT Provider Note   CSN: [id number] Arrival date & time: 09/30/17  1425     History   Chief Complaint Chief Complaint  Patient presents with  . Abdominal Pain    HPI Adam Gonzalez is a 57 y.o. male.  Patient states that a few days ago his current symptoms heavy and fell down back forward and the object fell on his abdomen.  Patient complains of tenderness to his left upper quadrant   The history is provided by the patient. No language interpreter was used.  Abdominal Pain   This is a new problem. The current episode started more than 2 days ago. The problem occurs constantly. The problem has not changed since onset.The pain is associated with trauma. Pertinent negatives include diarrhea, frequency, hematuria and headaches.    Past Medical History:  Diagnosis Date  . Medical history non-contributory     Patient Active Problem List   Diagnosis Date Noted  . Incarcerated left inguinal hernia 08/02/2015  . Left inguinal hernia 02/19/2014  . Inguinal hernia 12/17/2013  . Other malaise and fatigue 12/17/2013  . Nasal congestion 12/17/2013  . Encounter for screening colonoscopy 12/17/2013    Past Surgical History:  Procedure Laterality Date  . INGUINAL HERNIA REPAIR Right   . INGUINAL HERNIA REPAIR Left 08/02/2015  . INGUINAL HERNIA REPAIR Left 08/02/2015   Procedure: HERNIA REPAIR INGUINAL ADULT;  Surgeon: Mickeal Skinner, MD;  Location: [place];  Service: General;  Laterality: Left;  . NASAL SINUS SURGERY         Home Medications    Prior to Admission medications   Medication Sig Start Date End Date Taking? Authorizing Provider  aspirin 81 MG tablet Take 81 mg by mouth daily.   Yes [provider]  HYDROcodone-acetaminophen (NORCO) 5-325 MG tablet Take 1-2 tablets by mouth every 6 (six) hours as needed for severe pain. Patient not taking: Reported on 09/30/2017 09/27/17   Orlie Dakin, MD  traMADol (ULTRAM) 50 MG tablet  Take 1 tablet (50 mg total) by mouth every 6 (six) hours as needed. 09/30/17   Milton Ferguson, MD    Family History No family history on file.  Social History Social History   Tobacco Use  . Smoking status: Current Some Day Smoker    Packs/day: 0.25    Years: 8.00    Pack years: 2.00    Types: Cigars, Cigarettes  . Smokeless tobacco: Never Used  Substance Use Topics  . Alcohol use: Yes    Alcohol/week: 1.8 oz    Types: 3 Cans of beer per week  . Drug use: Yes    Comment: Smoked marjuana 3-4 yrs. ago     Allergies   Patient has no known allergies.   Review of Systems Review of Systems  Constitutional: Negative for appetite change and fatigue.  HENT: Negative for congestion, ear discharge and sinus pressure.   Eyes: Negative for discharge.  Respiratory: Negative for cough.   Cardiovascular: Negative for chest pain.  Gastrointestinal: Positive for abdominal pain. Negative for diarrhea.  Genitourinary: Negative for frequency and hematuria.  Musculoskeletal: Negative for back pain.  Skin: Negative for rash.  Neurological: Negative for seizures and headaches.  Psychiatric/Behavioral: Negative for hallucinations.     Physical Exam Updated Vital Signs BP (!) 119/93   Pulse 95   Temp 98.5 F (36.9 C) (Oral)   Resp 18   Ht 5\' 7"  (1.702 m)   Wt 72.6 kg (160 lb)   SpO2 99%  BMI 25.06 kg/m   Physical Exam  Constitutional: He is oriented to person, place, and time. He appears well-developed.  HENT:  Head: Normocephalic.  Eyes: Conjunctivae and EOM are normal. No scleral icterus.  Neck: Neck supple. No thyromegaly present.  Cardiovascular: Normal rate and regular rhythm. Exam reveals no gallop and no friction rub.  No murmur heard. Pulmonary/Chest: No stridor. He has no wheezes. He has no rales. He exhibits no tenderness.  Abdominal: He exhibits no distension. There is tenderness. There is no rebound.  Mild tender left upper quadrant  Musculoskeletal: Normal  range of motion. He exhibits no edema.  Lymphadenopathy:    He has no cervical adenopathy.  Neurological: He is oriented to person, place, and time. He exhibits normal muscle tone. Coordination normal.  Skin: No rash noted. No erythema.  Psychiatric: He has a normal mood and affect. His behavior is normal.  Nursing note and vitals reviewed.    ED Treatments / Results  Labs (all labs ordered are listed, but only abnormal results are displayed) Labs Reviewed  COMPREHENSIVE METABOLIC PANEL - Abnormal; Notable for the following components:      Result Value   Glucose, Bld 128 (*)    All other components within normal limits  CBC - Abnormal; Notable for the following components:   WBC 12.0 (*)    All other components within normal limits    EKG  EKG Interpretation None       Radiology Dg Ribs Unilateral W/chest Left  Result Date: 09/30/2017 CLINICAL DATA:  Pt injured Thursday lifting a dresser that fell back on him, pain left lateral to posterior ribs, pt was seen at [facility] but is still having pain EXAM: LEFT RIBS AND CHEST - 3+ VIEW COMPARISON:  Chest CT 09/28/2015 FINDINGS: Normal cardiac silhouette. There is volume loss in the RIGHT hemithorax. There is chronic interstitial markings in the RIGHT upper lobe. Multiple posterior RIGHT rib fractures noted. No pneumothorax. Dedicated views of the LEFT ribs demonstrate no displaced fracture. IMPRESSION: 1. No clear acute findings by radiograph. 2. Chronic interstitial change in the RIGHT lung with remote RIGHT rib fractures. 3. No LEFT rib fracture noted 4. Of note, there was a RIGHT upper lobe nodule measuring 2 cm on comparison CT from 09/28/2015. If no outside follow-up has been performed, recommend CT of the thorax for further evaluation and comparison to EXCLUDE NEOPLASM. Electronically Signed   By: Suzy Bouchard M.D.   On: 09/30/2017 15:38   Ct Abdomen Pelvis W Contrast  Result Date: 09/30/2017 CLINICAL DATA:  Upper abdominal pain  radiating to the back and left flank since Tuesday after fall. EXAM: CT ABDOMEN AND PELVIS WITH CONTRAST TECHNIQUE: Multidetector CT imaging of the abdomen and pelvis was performed using the standard protocol following bolus administration of intravenous contrast. CONTRAST:  162mL ISOVUE-300 IOPAMIDOL (ISOVUE-300) INJECTION 61% COMPARISON:  CT chest, abdomen and pelvis 09/28/2015 FINDINGS: Lower chest: Heart size is normal. No pericardial effusion is identified. No active pulmonary disease at the lung bases. Hepatobiliary: Homogeneous attenuation of the liver without laceration. No enhancing mass lesions are identified. The gallbladder is physiologically distended without focal mural thickening or calculi. No biliary dilatation is noted. Pancreas: No enhancing pancreatic mass or ductal dilatation. Spleen: Normal Adrenals/Urinary Tract: Normal bilateral adrenal glands. Symmetric enhancement of the kidneys without laceration. No obstructive uropathy or nephrolithiasis. No hydroureteronephrosis. The urinary bladder is somewhat thickened in appearance circumferentially which may be due to underdistention. Cystitis is not excluded. Stomach/Bowel: The stomach is decompressed  in appearance. Normal small bowel rotation is identified. There does not appear to be evidence of small bowel hematoma. No wall thickening of small bowel is seen. Moderate retained stool is noted within the colon. Normal-appearing appendix. Vascular/Lymphatic: The aorta and branch vessels are patent. No aneurysm or dissection of the aorta. No aortic rupture. No enhancement of the aortic wall to suggest aortitis. Enlarged right retrocrural lymph node measuring 11 mm short axis is noted with more faint para-aortic retroperitoneal adenopathy also suspected measuring up to 11 mm short axis, series 2, image 37 adjacent to the infra renal aorta. There is mild moderate diffuse retroperitoneal edema of uncertain etiology without frank active hemorrhage.  Reproductive: Prostate is mildly enlarged up to 5 cm. Other: No free air nor free fluid. 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 Sclerotic foci of the right inferior pubic ramus, nonspecific but stable since prior 2017 comparison. IMPRESSION: 1. Unusual mild-to-moderate retroperitoneal edema. No evidence of retroperitoneal hematoma or active hemorrhage. Although this could be post traumatic in etiology, given presence of retroperitoneal and retrocrural adenopathy, this may be associated with a separate nonspecific entity in itself, possibly a retroperitoneal inflammatory process process, fibrosis, a lymphoproliferative abnormality or a neoplastic etiology possibly metastatic disease given 2 cm right upper lobe nodule noted on prior chest CT with mediastinal and hilar adenopathy. Short-term interval follow-up is recommended given adenopathy. Pertinent negatives would include no abnormal aortic hemorrhage, enhancement of the aortic wall to suggest in aortitis to explain the para-aortic edema nor adjacent fractures. 2. Mild prostatic enlargement. Nonspecific sclerotic densities in the right inferior pubic ramus in the absence of known malignancy that would predispose to bone, these more likely represent bone islands. 3. No acute solid nor hollow visceral organ injury is identified. Electronically Signed   By: Ashley Royalty M.D.   On: 09/30/2017 19:12    Procedures Procedures (including critical care time)  Medications Ordered in ED Medications  HYDROmorphone (DILAUDID) injection 0.5 mg (0.5 mg Intravenous Given 09/30/17 1807)  ondansetron (ZOFRAN) injection 4 mg (4 mg Intravenous Given 09/30/17 1808)  iopamidol (ISOVUE-300) 61 % injection 100 mL (100 mLs Intravenous Contrast Given 09/30/17 1749)   Labs including CBC and chemistries unremarkable except for mild elevation in the white blood count.  CT scan shows possible lymphadenopathy in the abdomen but  no evidence of trauma.  Patient was told that he need to follow-up with a GI doctor and a family doctor and they would need to follow-up on these areas that appear to be adenopathy in his abdomen  Initial Impression / Assessment and Plan / ED Course  I have reviewed the triage vital signs and the nursing notes.  Pertinent labs & imaging results that were available during my care of the patient were reviewed by me and considered in my medical decision making (see chart for details).       Final Clinical Impressions(s) / ED Diagnoses   Final diagnoses:  Fall, initial encounter    ED Discharge Orders        Ordered    traMADol (ULTRAM) 50 MG tablet  Every 6 hours PRN     09/30/17 Marykay Lex, MD 09/30/17 2006

## 2017-09-30 NOTE — Discharge Instructions (Signed)
Follow-up with the stomach doctor Dr. Sydell Axon.  And also follow-up with a family doctor.

## 2017-09-30 NOTE — ED Triage Notes (Addendum)
Patient c/o upper abd pain that radiates into back/left flank that Thursday after falling down stairs with dresser falling on top of him. Patient states nausea and vomiting. Denies any urinary symptoms, diarrhea, or fevers. Patient states seen at [facility] ER on Thursday and had x-rayed neck and back. Patient told muscle pain related to injury and given hydrocodone while there. Per patient pain getting progressively worse.

## 2017-10-14 ENCOUNTER — Emergency Department (HOSPITAL_COMMUNITY)
Admission: EM | Admit: 2017-10-14 | Discharge: 2017-10-14 | Disposition: A | Discharge status: Home / Self Care | Payer: Worker's Compensation | Attending: Emergency Medicine | Admitting: Emergency Medicine

## 2017-10-14 ENCOUNTER — Other Ambulatory Visit: Payer: Self-pay

## 2017-10-14 ENCOUNTER — Encounter (HOSPITAL_COMMUNITY): Payer: Self-pay | Admitting: Emergency Medicine

## 2017-10-14 DIAGNOSIS — T1591XA Foreign body on external eye, part unspecified, right eye, initial encounter: Secondary | ICD-10-CM

## 2017-10-14 DIAGNOSIS — Y9389 Activity, other specified: Secondary | ICD-10-CM | POA: Diagnosis not present

## 2017-10-14 DIAGNOSIS — R0782 Intercostal pain: Secondary | ICD-10-CM | POA: Insufficient documentation

## 2017-10-14 DIAGNOSIS — Y999 Unspecified external cause status: Secondary | ICD-10-CM | POA: Diagnosis not present

## 2017-10-14 DIAGNOSIS — Y9289 Other specified places as the place of occurrence of the external cause: Secondary | ICD-10-CM | POA: Diagnosis not present

## 2017-10-14 DIAGNOSIS — S0591XA Unspecified injury of right eye and orbit, initial encounter: Secondary | ICD-10-CM | POA: Diagnosis present

## 2017-10-14 DIAGNOSIS — W228XXA Striking against or struck by other objects, initial encounter: Secondary | ICD-10-CM | POA: Insufficient documentation

## 2017-10-14 DIAGNOSIS — Z7982 Long term (current) use of aspirin: Secondary | ICD-10-CM | POA: Diagnosis not present

## 2017-10-14 DIAGNOSIS — F17210 Nicotine dependence, cigarettes, uncomplicated: Secondary | ICD-10-CM | POA: Diagnosis not present

## 2017-10-14 MED ORDER — FLUORESCEIN SODIUM 1 MG OP STRP
1.0000 | ORAL_STRIP | Freq: Once | OPHTHALMIC | Status: AC
  Administered 2017-10-14: 1 via OPHTHALMIC
  Filled 2017-10-14: qty 1

## 2017-10-14 MED ORDER — TETRACAINE HCL 0.5 % OP SOLN
2.0000 [drp] | Freq: Once | OPHTHALMIC | Status: AC
  Administered 2017-10-14: 2 [drp] via OPHTHALMIC
  Filled 2017-10-14: qty 4

## 2017-10-14 MED ORDER — ERYTHROMYCIN 5 MG/GM OP OINT
TOPICAL_OINTMENT | Freq: Once | OPHTHALMIC | Status: AC
  Administered 2017-10-14: 12:00:00 via OPHTHALMIC
  Filled 2017-10-14: qty 3.5

## 2017-10-14 NOTE — ED Provider Notes (Signed)
[facility] EMERGENCY DEPARTMENT Provider Note   CSN: [id number] Arrival date & time: 10/14/17  2458     History   Chief Complaint Chief Complaint  Patient presents with  . Foreign Body in Eye    HPI Adam Gonzalez is a 57 y.o. male.  HPI   Adam Gonzalez is a 57 y.o. male who presents to the Emergency Department complaining of a foreign body to right eye for 3 days.  States that he was working outside pulling weeds when he felt something in his right eye.  He attempted to flush his eye with tap water without success.  Pain to the eye associated with blinking.  He noticed a "black spot" to his eye and tried to removed it using Q-tips. Pain increased yesterday.  He denies facial swelling, headaches, dizziness, and visual deficit.  Does not wear corrective lenses.   Past Medical History:  Diagnosis Date  . Medical history non-contributory     Patient Active Problem List   Diagnosis Date Noted  . Incarcerated left inguinal hernia 08/02/2015  . Left inguinal hernia 02/19/2014  . Inguinal hernia 12/17/2013  . Other malaise and fatigue 12/17/2013  . Nasal congestion 12/17/2013  . Encounter for screening colonoscopy 12/17/2013    Past Surgical History:  Procedure Laterality Date  . INGUINAL HERNIA REPAIR Right   . INGUINAL HERNIA REPAIR Left 08/02/2015  . INGUINAL HERNIA REPAIR Left 08/02/2015   Procedure: HERNIA REPAIR INGUINAL ADULT;  Surgeon: Mickeal Skinner, MD;  Location: [place];  Service: General;  Laterality: Left;  . NASAL SINUS SURGERY          Home Medications    Prior to Admission medications   Medication Sig Start Date End Date Taking? Authorizing Provider  aspirin 81 MG tablet Take 81 mg by mouth daily.    [provider]  HYDROcodone-acetaminophen (NORCO) 5-325 MG tablet Take 1-2 tablets by mouth every 6 (six) hours as needed for severe pain. Patient not taking: Reported on 09/30/2017 09/27/17   Orlie Dakin, MD  traMADol (ULTRAM) 50 MG  tablet Take 1 tablet (50 mg total) by mouth every 6 (six) hours as needed. 09/30/17   Milton Ferguson, MD    Family History No family history on file.  Social History Social History   Tobacco Use  . Smoking status: Current Some Day Smoker    Packs/day: 0.25    Years: 8.00    Pack years: 2.00    Types: Cigars, Cigarettes  . Smokeless tobacco: Never Used  Substance Use Topics  . Alcohol use: Yes    Alcohol/week: 1.8 oz    Types: 3 Cans of beer per week  . Drug use: Not Currently     Allergies   Patient has no known allergies.   Review of Systems Review of Systems  Constitutional: Negative for chills and fever.  HENT: Negative for congestion and facial swelling.   Eyes: Positive for pain. Negative for photophobia, redness and visual disturbance.       Foreign body right eye  Musculoskeletal: Negative for arthralgias and myalgias.  Skin: Negative for rash.  Neurological: Negative for dizziness, facial asymmetry, weakness and headaches.     Physical Exam Updated Vital Signs BP 122/86 (BP Location: Right Arm)   Pulse 81   Temp 98.4 F (36.9 C) (Oral)   Resp 18   Ht 5\' 7"  (1.702 m)   Wt 72.6 kg (160 lb)   SpO2 99%   BMI 25.06 kg/m   Physical Exam  Constitutional: He is oriented to person, place, and time. He appears well-developed and well-nourished. No distress.  HENT:  Head: Normocephalic and atraumatic.  Mouth/Throat: Oropharynx is clear and moist.  Eyes: Pupils are equal, round, and reactive to light. Conjunctivae and EOM are normal. Right eye exhibits no chemosis. Foreign body present in the right eye. Right conjunctiva is not injected. Left conjunctiva is not injected.  Fundoscopic exam:      The right eye shows no hemorrhage and no papilledema.  Slit lamp exam:      The right eye shows fluorescein uptake. The right eye shows no corneal ulcer and no hyphema.  Slit lamp exam reveals negative Seidel's sign, small area of fluorescein uptake to the lower cornea  at 6 o'clock position with what appears to be a tear.  no hyphema. Globe intact.   Neck: Normal range of motion. Neck supple. No thyromegaly present.  Cardiovascular: Normal rate and regular rhythm.  Pulmonary/Chest: Effort normal and breath sounds normal. No respiratory distress.  Musculoskeletal: Normal range of motion.  Lymphadenopathy:    He has no cervical adenopathy.  Neurological: He is alert and oriented to person, place, and time. No sensory deficit. He exhibits normal muscle tone. Coordination normal.  Skin: Skin is warm and dry. No rash noted.  Nursing note and vitals reviewed.    ED Treatments / Results  Labs (all labs ordered are listed, but only abnormal results are displayed) Labs Reviewed - No data to display  EKG None  Radiology No results found.  Procedures Procedures (including critical care time)    Visual Acuity  Right Eye Distance: 20/40 Left Eye Distance: 20/30 Bilateral Distance: 20/30    Medications Ordered in ED Medications  tetracaine (PONTOCAINE) 0.5 % ophthalmic solution 2 drop (2 drops Right Eye Given by Other 10/14/17 1155)  fluorescein ophthalmic strip 1 strip (1 strip Right Eye Given by Other 10/14/17 1155)  erythromycin ophthalmic ointment ( Right Eye Given 10/14/17 1155)     Initial Impression / Assessment and Plan / ED Course  I have reviewed the triage vital signs and the nursing notes.  Pertinent labs & imaging results that were available during my care of the patient were reviewed by me and considered in my medical decision making (see chart for details).      Patient with possible retained FB vs corneal tear. Globe intact. Pt uncooperative for exam.   32 Consulted Dr. Noel Journey.  States his partner Dr. Adriana Reams, will see pt in office tomorrow.  Recommends erythromycin ointment and OTC lubrication drops. Pt agrees to tx plan and close f/u.  Return precautions discussed.    Final Clinical Impressions(s) / ED Diagnoses    Final diagnoses:  Foreign body of right eye, initial encounter    ED Discharge Orders    None       Kem Parkinson, PA-C 10/14/17 [place], [place], DO 10/16/17 1306

## 2017-10-14 NOTE — ED Notes (Signed)
Opthamology MD call sent to PA at this time.

## 2017-10-14 NOTE — ED Notes (Addendum)
Pt c/o left rib cage pain after moving furniture down stairs and the furniture slid and hit him in the left side of chest approximately 2 weeks ago. Pt c/o pain to left rib cage when he moves. Pt reports he was seen at [facility] at the time of the accident and a chest xray was performed. Pt was then seen a week later here at [facility] due to persistent pain and abdominal/pelvis CT was done at that time. Pt was told to follow up outpt with his doctor but pt didn't have one. Pt reports he tried to follow up with a PCP in [place] but they will not see him until they receive the xray and CT reports from the hospital, which they have not yet received per pt. Pt reporting continued pain to the left rib cage area that is not improving.

## 2017-10-14 NOTE — Discharge Instructions (Addendum)
Apply OTC lubricating drops one drop every 2 hours as needed.  Apply the erythromycin ointment small amount every 4 hours.  Dr.Marchase's partner, Dr. Adriana Reams, will see you in the office tomorrow.  Call the office at [phone] at 730 to 8:00 in the morning to arrange a follow-up appointment time.

## 2017-10-14 NOTE — ED Triage Notes (Signed)
Patient c/o right eye pain from foreign object. Patient states he was pulling weeds on Thursday when he felt something go into eye. Patient tried to flush eye but could not see anything in eye. Patient states yesterday pain increased and he noticed a black dot on iris but was unable to remove despite flushing and using a q-tip. Patient states only "slight" blurred vision in eye.

## 2017-10-16 ENCOUNTER — Encounter: Payer: Self-pay | Admitting: Internal Medicine

## 2017-10-30 ENCOUNTER — Encounter: Payer: Self-pay | Admitting: Gastroenterology

## 2017-12-17 ENCOUNTER — Ambulatory Visit: Payer: Self-pay | Admitting: Gastroenterology

## 2018-03-14 ENCOUNTER — Telehealth: Payer: Self-pay | Admitting: Internal Medicine

## 2018-03-14 ENCOUNTER — Encounter: Payer: Self-pay | Admitting: Internal Medicine

## 2018-03-14 ENCOUNTER — Ambulatory Visit: Payer: Self-pay | Admitting: Gastroenterology

## 2018-03-14 NOTE — Telephone Encounter (Signed)
Patient was a no show and letter sent  °

## 2019-02-25 ENCOUNTER — Encounter (HOSPITAL_COMMUNITY): Payer: Self-pay

## 2019-02-25 ENCOUNTER — Emergency Department (HOSPITAL_COMMUNITY): Payer: Managed Care, Other (non HMO)

## 2019-02-25 ENCOUNTER — Other Ambulatory Visit: Payer: Self-pay

## 2019-02-25 ENCOUNTER — Emergency Department (HOSPITAL_COMMUNITY)
Admission: EM | Admit: 2019-02-25 | Discharge: 2019-02-25 | Disposition: A | Discharge status: Home / Self Care | Payer: Managed Care, Other (non HMO) | Attending: Emergency Medicine | Admitting: Emergency Medicine

## 2019-02-25 DIAGNOSIS — F17210 Nicotine dependence, cigarettes, uncomplicated: Secondary | ICD-10-CM | POA: Insufficient documentation

## 2019-02-25 DIAGNOSIS — Z7982 Long term (current) use of aspirin: Secondary | ICD-10-CM | POA: Insufficient documentation

## 2019-02-25 DIAGNOSIS — M545 Low back pain, unspecified: Secondary | ICD-10-CM

## 2019-02-25 MED ORDER — IBUPROFEN 600 MG PO TABS: 600.0000 mg | ORAL_TABLET | Freq: Four times a day (QID) | ORAL | 0 refills | Status: DC | PRN

## 2019-02-25 MED ORDER — LIDOCAINE 5 % EX PTCH: 1.0000 | MEDICATED_PATCH | CUTANEOUS | 0 refills | Status: DC

## 2019-02-25 MED ORDER — CYCLOBENZAPRINE HCL 10 MG PO TABS: 10.0000 mg | ORAL_TABLET | Freq: Two times a day (BID) | ORAL | 0 refills | Status: DC | PRN

## 2019-02-25 MED ORDER — NAPROXEN 250 MG PO TABS
500.0000 mg | ORAL_TABLET | Freq: Once | ORAL | Status: AC
Start: 2019-02-25 — End: 2019-02-25
  Administered 2019-02-25: 500 mg via ORAL
  Filled 2019-02-25: qty 2

## 2019-02-25 MED ORDER — ACETAMINOPHEN 500 MG PO TABS: 500.0000 mg | ORAL_TABLET | Freq: Four times a day (QID) | ORAL | 0 refills | Status: DC | PRN

## 2019-02-25 MED ORDER — LIDOCAINE 5 % EX PTCH
1.0000 | MEDICATED_PATCH | CUTANEOUS | Status: DC
  Administered 2019-02-25: 1 via TRANSDERMAL
  Filled 2019-02-25: qty 1

## 2019-02-25 NOTE — ED Triage Notes (Signed)
Pt states that he has gradually began to have lower back pain that he has put off getting checked out d/t not wanting to come to hospital because of [place]. 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

## 2019-02-25 NOTE — ED Provider Notes (Signed)
[name] Provider Note   CSN: [id number] Arrival date & time: 02/25/19  1941    History   Chief Complaint Chief Complaint  Patient presents with  . Back Pain    HPI Adam Gonzalez is a 58 y.o. male presents for evaluation of cute onset, persistent low back pain for 3 months.  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  He reports that he has had back pain daily that he describes as a sharp pain that worsens with position changes, improves with leaning forward or laying on his side.  Pain does not radiate to the lower extremities, denies numbness or weakness.  No bowel or bladder incontinence, no saddle anesthesia, no fevers, no night sweats or unintended weight loss.  History of IV drug use, abdominal pain, or urinary symptoms.  Has not tried anything for his symptoms.  Of note, he is being followed by a urologist at [facility] and a plastic surgeon at [facility] for evaluation of an elevated PSA.  He recently had biopsies of his prostate taken but is unsure of the results.     The history is provided by the patient.    Past Medical History:  Diagnosis Date  . Medical history non-contributory     Patient Active Problem List   Diagnosis Date Noted  . Incarcerated left inguinal hernia 08/02/2015  . Left inguinal hernia 02/19/2014  . Inguinal hernia 12/17/2013  . Other malaise and fatigue 12/17/2013  . Nasal congestion 12/17/2013  . Encounter for screening colonoscopy 12/17/2013    Past Surgical History:  Procedure Laterality Date  . INGUINAL HERNIA REPAIR Right   . INGUINAL HERNIA REPAIR Left 08/02/2015  . INGUINAL HERNIA REPAIR Left 08/02/2015   Procedure: HERNIA REPAIR INGUINAL ADULT;  Surgeon: Mickeal Skinner, MD;  Location: [place];  Service: General;  Laterality: Left;  . NASAL SINUS SURGERY           Home Medications    Prior to Admission medications   Medication Sig Start Date End Date Taking? Authorizing Provider  acetaminophen (TYLENOL) 500 MG tablet Take 1 tablet (500 mg total) by mouth every 6 (six) hours as needed. 02/25/19   Trudi Morgenthaler A, PA-C  aspirin 81 MG tablet Take 81 mg by mouth daily.    [provider]  cyclobenzaprine (FLEXERIL) 10 MG tablet Take 1 tablet (10 mg total) by mouth 2 (two) times daily as needed. 02/25/19   Tiffancy Moger A, PA-C  HYDROcodone-acetaminophen (NORCO) 5-325 MG tablet Take 1-2 tablets by mouth every 6 (six) hours as needed for severe pain. Patient not taking: Reported on 09/30/2017 09/27/17   Orlie Dakin, MD  ibuprofen (ADVIL) 600 MG tablet Take 1 tablet (600 mg total) by mouth every 6 (six) hours as needed. 02/25/19   Tijuan Dantes A, PA-C  lidocaine (LIDODERM) 5 % Place 1 patch onto the skin daily. Remove & Discard patch within 12 hours or as directed by MD 02/25/19   Rodell Perna A, PA-C  traMADol (ULTRAM) 50 MG tablet Take 1 tablet (50 mg total) by mouth every 6 (six) hours as needed. 09/30/17   Milton Ferguson, MD    Family History History reviewed. No pertinent family history.  Social History Social History   Tobacco Use  . Smoking status: Current Some Day Smoker    Packs/day: 0.25  Years: 8.00    Pack years: 2.00    Types: Cigars, Cigarettes  . Smokeless tobacco: Never Used  Substance Use Topics  . Alcohol use: Yes    Alcohol/week: 3.0 standard drinks    Types: 3 Cans of beer per week  . Drug use: Not Currently     Allergies   Patient has no known allergies.   Review of Systems Review of Systems  Constitutional: Negative for chills, fever and unexpected weight change.  Musculoskeletal: Positive for back pain.  Neurological: Negative for weakness and numbness.     Physical Exam Updated Vital Signs BP 132/81 (BP Location: Right Arm)   Pulse 79   Temp 98.2 F (36.8 C) (Oral)   Resp 16   Ht 5\' 9"  (1.753 m)   Wt  72.6 kg   SpO2 98%   BMI 23.63 kg/m   Physical Exam Vitals signs and nursing note reviewed.  Constitutional:      General: He is not in acute distress.    Appearance: He is well-developed.     Comments: Resting comfortably in chair, leaning forward  HENT:     Head: Normocephalic and atraumatic.  Eyes:     General:        Right eye: No discharge.        Left eye: No discharge.     Conjunctiva/sclera: Conjunctivae normal.  Neck:     Vascular: No JVD.     Trachea: No tracheal deviation.  Cardiovascular:     Rate and Rhythm: Normal rate and regular rhythm.     Pulses: Normal pulses.     Comments: 2+ DP/PT pulses bilaterally, Homans sign absent bilaterally, no lower extremity edema, no palpable cords, compartments are soft  Pulmonary:     Effort: Pulmonary effort is normal.     Breath sounds: Normal breath sounds.  Abdominal:     General: Abdomen is flat. Bowel sounds are normal. There is no distension.     Palpations: Abdomen is soft.     Tenderness: There is no abdominal tenderness. There is no guarding or rebound.  Musculoskeletal:        General: Tenderness present.     Comments: No midline lumbar spine tenderness, right paralumbar muscle tenderness elicited with extension of the right lower extremity against resistance.  Mild right SI joint tenderness.  Negative straight leg raise bilaterally.  No pain with passive ROM of the hips.  5/5 strength of BLE major muscle groups.  Pain also elicited with flexion at the lumbar spine.  Skin:    General: Skin is warm and dry.     Findings: No erythema.  Neurological:     Mental Status: He is alert.     Comments: Fluent speech, sensation intact to light touch of bilateral lower extremities.  Ambulates with an antalgic gait, prefers to lean forward, but able to heel walk and toe walk without difficulty and exhibits good balance.  Psychiatric:        Behavior: Behavior normal.      ED Treatments / Results  Labs (all labs ordered  are listed, but only abnormal results are displayed) Labs Reviewed - No data to display  EKG None  Radiology Dg Lumbar Spine Complete  Result Date: 02/25/2019 CLINICAL DATA:  Low back pain EXAM: LUMBAR SPINE - COMPLETE 4+ VIEW COMPARISON:  September 27, 2017 FINDINGS: Frontal, lateral, spot lumbosacral lateral, and bilateral oblique views were obtained. There are 5 non-rib-bearing lumbar type vertebral bodies. There is no fracture  or spondylolisthesis. There is stable slight disc space narrowing at L2-3, L3-4, and L4-5. Anterior osteophytes are noted at L3, L4, and L5, stable. There is no appreciable facet arthropathy. IMPRESSION: Stable mild disc space narrowing at L2-3, L3-4, and L4-5. no fracture or spondylolisthesis. Electronically Signed   By: Lowella Grip III M.D.   On: 02/25/2019 09:06    Procedures Procedures (including critical care time)  Medications Ordered in ED Medications  lidocaine (LIDODERM) 5 % 1 patch (1 patch Transdermal Patch Applied 02/25/19 0850)  naproxen (NAPROSYN) tablet 500 mg (500 mg Oral Given 02/25/19 0850)     Initial Impression / Assessment and Plan / ED Course  I have reviewed the triage vital signs and the nursing notes.  Pertinent labs & imaging results that were available during my care of the patient were reviewed by me and considered in my medical decision making (see chart for details).        Patient with low back pain going on for 3 months.  He is afebrile, vital signs are stable, he is nontoxic in appearance.  He is neurovascularly intact.  He is ambulatory despite pain.  No red flag signs concerning for cauda equina or spinal abscess, radiographs were obtained to rule out any obvious bony lesions or occult compression fractures which showed stable mild disc space narrowing at L1-2 through L5.  No concern for dissection. Conservative therapy indicated and discussed with patient.  Discussed side effects of anti-inflammatories and Flexeril.   Recommend follow with PCP or orthopedist for reevaluation of his symptoms.  Discussed strict ED return precautions. Patient verbalized understanding of and agreement with plan and is safe for discharge home at this time.   Final Clinical Impressions(s) / ED Diagnoses   Final diagnoses:  Acute bilateral low back pain without sciatica    ED Discharge Orders         Ordered    lidocaine (LIDODERM) 5 %  Every 24 hours     02/25/19 0920    ibuprofen (ADVIL) 600 MG tablet  Every 6 hours PRN     02/25/19 0920    acetaminophen (TYLENOL) 500 MG tablet  Every 6 hours PRN     02/25/19 0920    cyclobenzaprine (FLEXERIL) 10 MG tablet  2 times daily PRN     02/25/19 0926           Renita Papa, PA-C 02/25/19 0927    Lennice Sites, DO 02/25/19 1012

## 2019-02-25 NOTE — ED Notes (Signed)
Pt verbalized understanding of d/c instructions and has no further questions, VSS.

## 2019-02-25 NOTE — Discharge Instructions (Signed)
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  There was no evidence of fractures or abnormal bone lesions.  1. Medications: You can alternate 600 mg of ibuprofen and [phone] mg of Tylenol every 3 hours as needed for pain. Do not exceed 4000 mg of Tylenol daily.  Take ibuprofen with food to avoid upset stomach issues.  You can take Flexeril as needed for muscle spasm up to twice daily but do not drive, drink alcohol, or operate heavy machinery while taking this medicine because it may make you drowsy.  I typically recommend taking this medicine only at night when you are going to sleep.  You can also cut these tablets in half if they make you feel very drowsy.  Apply lidocaine patches to areas of pain.   2. Treatment: rest, drink plenty of fluids, gentle stretching as discussed (see attached), alternate ice and heat (or stick with whichever feels best) 20 minutes on 20 minutes off. 3. Follow Up: Please followup with your primary doctor in 3-7 days for discussion of your diagnoses and further evaluation after today's visit; I have also provided information for a neurosurgeon you can follow-up with; return to the ER for worsening back pain, difficulty walking, loss of bowel or bladder control or other concerning symptoms

## 2019-04-08 ENCOUNTER — Emergency Department (HOSPITAL_COMMUNITY)
Admission: EM | Admit: 2019-04-08 | Discharge: 2019-04-08 | Disposition: A | Discharge status: Home / Self Care | Payer: Managed Care, Other (non HMO) | Attending: Emergency Medicine | Admitting: Emergency Medicine

## 2019-04-08 ENCOUNTER — Encounter (HOSPITAL_COMMUNITY): Payer: Self-pay | Admitting: Emergency Medicine

## 2019-04-08 DIAGNOSIS — W2209XA Striking against other stationary object, initial encounter: Secondary | ICD-10-CM | POA: Insufficient documentation

## 2019-04-08 DIAGNOSIS — Y939 Activity, unspecified: Secondary | ICD-10-CM | POA: Insufficient documentation

## 2019-04-08 DIAGNOSIS — F17210 Nicotine dependence, cigarettes, uncomplicated: Secondary | ICD-10-CM | POA: Insufficient documentation

## 2019-04-08 DIAGNOSIS — Y929 Unspecified place or not applicable: Secondary | ICD-10-CM | POA: Insufficient documentation

## 2019-04-08 DIAGNOSIS — S01512A Laceration without foreign body of oral cavity, initial encounter: Secondary | ICD-10-CM | POA: Insufficient documentation

## 2019-04-08 DIAGNOSIS — Y999 Unspecified external cause status: Secondary | ICD-10-CM | POA: Insufficient documentation

## 2019-04-08 MED ORDER — AMOXICILLIN 500 MG PO CAPS: 500.0000 mg | ORAL_CAPSULE | Freq: Three times a day (TID) | ORAL | 0 refills | Status: DC

## 2019-04-08 NOTE — ED Triage Notes (Signed)
Pt here from home with c/o a small hole in the roof of his mouth , pt has been wearing some temporary dentures

## 2019-04-08 NOTE — ED Provider Notes (Signed)
[facility]ENCY DEPARTMENT Provider Note   CSN: [id number] Arrival date & time: 04/08/19  1222     History   Chief Complaint No chief complaint on file.   HPI Adam Gonzalez is a 58 y.o. male.     Pt reports he had teeth pulled.  Pt reports dentures have rub a hole in the top of his mouth  Pt reports area goes up into sinuses.  Pt complains of difficulty breathing  The history is provided by the patient. No language interpreter was used.    Past Medical History:  Diagnosis Date  . Medical history non-contributory     Patient Active Problem List   Diagnosis Date Noted  . Incarcerated left inguinal hernia 08/02/2015  . Left inguinal hernia 02/19/2014  . Inguinal hernia 12/17/2013  . Other malaise and fatigue 12/17/2013  . Nasal congestion 12/17/2013  . Encounter for screening colonoscopy 12/17/2013    Past Surgical History:  Procedure Laterality Date  . INGUINAL HERNIA REPAIR Right   . INGUINAL HERNIA REPAIR Left 08/02/2015  . INGUINAL HERNIA REPAIR Left 08/02/2015   Procedure: HERNIA REPAIR INGUINAL ADULT;  Surgeon: Mickeal Skinner, MD;  Location: [facility];  Service: General;  Laterality: Left;  . NASAL SINUS SURGERY          Home Medications    Prior to Admission medications   Medication Sig Start Date End Date Taking? Authorizing Provider  acetaminophen (TYLENOL) 500 MG tablet Take 1 tablet (500 mg total) by mouth every 6 (six) hours as needed. 02/25/19   Fawze, Mina A, PA-C  amoxicillin (AMOXIL) 500 MG capsule Take 1 capsule (500 mg total) by mouth 3 (three) times daily. 04/08/19   Fransico Meadow, PA-C  aspirin 81 MG tablet Take 81 mg by mouth daily.    [provider]  cyclobenzaprine (FLEXERIL) 10 MG tablet Take 1 tablet (10 mg total) by mouth 2 (two) times daily as needed. 02/25/19   Fawze, Mina A, PA-C  HYDROcodone-acetaminophen (NORCO) 5-325 MG tablet Take 1-2 tablets by mouth every 6 (six) hours as needed for severe  pain. Patient not taking: Reported on 09/30/2017 09/27/17   Orlie Dakin, MD  ibuprofen (ADVIL) 600 MG tablet Take 1 tablet (600 mg total) by mouth every 6 (six) hours as needed. 02/25/19   Fawze, Mina A, PA-C  lidocaine (LIDODERM) 5 % Place 1 patch onto the skin daily. Remove & Discard patch within 12 hours or as directed by MD 02/25/19   Rodell Perna A, PA-C  traMADol (ULTRAM) 50 MG tablet Take 1 tablet (50 mg total) by mouth every 6 (six) hours as needed. 09/30/17   Milton Ferguson, MD    Family History No family history on file.  Social History Social History   Tobacco Use  . Smoking status: Current Some Day Smoker    Packs/day: 0.25    Years: 8.00    Pack years: 2.00    Types: Cigars, Cigarettes  . Smokeless tobacco: Never Used  Substance Use Topics  . Alcohol use: Yes    Alcohol/week: 3.0 standard drinks    Types: 3 Cans of beer per week  . Drug use: Not Currently     Allergies   Patient has no known allergies.   Review of Systems Review of Systems  HENT: Positive for dental problem.   All other systems reviewed and are negative.    Physical Exam Updated Vital Signs BP (!) 138/6   Pulse 87   Temp 98.4  F (36.9 C) (Oral)   Resp 18   SpO2 99%   Physical Exam Vitals signs and nursing note reviewed.  Constitutional:      Appearance: He is well-developed.  HENT:     Head: Normocephalic and atraumatic.     Nose: Nose normal.     Mouth/Throat:     Comments: 2mm open area top of mouth  Eyes:     Conjunctiva/sclera: Conjunctivae normal.  Neck:     Musculoskeletal: Neck supple.  Cardiovascular:     Rate and Rhythm: Normal rate and regular rhythm.     Heart sounds: No murmur.  Pulmonary:     Effort: Pulmonary effort is normal. No respiratory distress.     Breath sounds: Normal breath sounds.  Abdominal:     Palpations: Abdomen is soft.     Tenderness: There is no abdominal tenderness.  Musculoskeletal: Normal range of motion.  Skin:    General: Skin is  warm and dry.  Neurological:     General: No focal deficit present.     Mental Status: He is alert.      ED Treatments / Results  Labs (all labs ordered are listed, but only abnormal results are displayed) Labs Reviewed - No data to display  EKG None  Radiology No results found.  Procedures Procedures (including critical care time)  Medications Ordered in ED Medications - No data to display   Initial Impression / Assessment and Plan / ED Course  I have reviewed the triage vital signs and the nursing notes.  Pertinent labs & imaging results that were available during my care of the patient were reviewed by me and considered in my medical decision making (see chart for details).        MDM  Pt advised to follow up with oral surgeon for evaluation   Final Clinical Impressions(s) / ED Diagnoses   Final diagnoses:  Laceration of palate, initial encounter    ED Discharge Orders         Ordered    amoxicillin (AMOXIL) 500 MG capsule  3 times daily     04/08/19 1503        An After Visit Summary was printed and given to the patient.    Sidney Ace 04/08/19 [place]    Carmin Muskrat, MD 04/09/19 [phone]

## 2019-04-08 NOTE — Discharge Instructions (Addendum)
Schedule to see the oral surgeon for evaluation

## 2019-04-08 NOTE — ED Notes (Signed)
Pt verbalized understanding of discharge instructions and denies any further questions at this time.   

## 2020-03-26 ENCOUNTER — Ambulatory Visit: Payer: Self-pay | Admitting: Oral Surgery

## 2020-03-26 ENCOUNTER — Encounter (HOSPITAL_BASED_OUTPATIENT_CLINIC_OR_DEPARTMENT_OTHER): Payer: Self-pay | Admitting: Oral Surgery

## 2020-03-26 ENCOUNTER — Other Ambulatory Visit: Payer: Self-pay

## 2020-03-27 ENCOUNTER — Other Ambulatory Visit (HOSPITAL_COMMUNITY): Payer: Managed Care, Other (non HMO)

## 2020-03-30 ENCOUNTER — Other Ambulatory Visit (HOSPITAL_COMMUNITY)
Admission: RE | Admit: 2020-03-30 | Discharge: 2020-03-30 | Disposition: A | Discharge status: Home / Self Care | Payer: Managed Care, Other (non HMO) | Source: Ambulatory Visit | Attending: Oral Surgery | Admitting: Oral Surgery

## 2020-03-30 DIAGNOSIS — Z01812 Encounter for preprocedural laboratory examination: Secondary | ICD-10-CM | POA: Insufficient documentation

## 2020-03-30 DIAGNOSIS — Z20822 Contact with and (suspected) exposure to covid-19: Secondary | ICD-10-CM | POA: Insufficient documentation

## 2020-03-30 LAB — SARS CORONAVIRUS 2 (TAT 6-24 HRS): SARS Coronavirus 2: NEGATIVE

## 2020-03-30 NOTE — H&P (Signed)
Adam Gonzalez is an 59 y.o. male.   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 Presents for surgical closure. C/o hypernasal speech, food/fluids getting into nose.  Past Medical History:  Diagnosis Date  . Medical history non-contributory   . Oronasal fistula     Past Surgical History:  Procedure Laterality Date  . INGUINAL HERNIA REPAIR Right   . INGUINAL HERNIA REPAIR Left 08/02/2015  . INGUINAL HERNIA REPAIR Left 08/02/2015   Procedure: HERNIA REPAIR INGUINAL ADULT;  Surgeon: [facility] Skinner, MD;  Location: [place];  Service: General;  Laterality: Left;  . NASAL SINUS SURGERY      History reviewed. No pertinent family history. Social History:  reports that he has been smoking cigars and cigarettes. He has a 2.00 pack-year smoking history. He has never used smokeless tobacco. He reports current alcohol use of about 3.0 standard drinks of alcohol per week. He reports current drug use. Drug: Marijuana.  Allergies: No Known Allergies  No medications prior to admission.    No results found for this or any previous visit (from the past 48 hour(s)). No results found.  Review of Systems: other than HPI, neg  Height 5\' 8"  (1.727 m), weight 68 kg. Physical Exam  Vitals: 83; 120/78, 16, 98% RA Gen: a&o, nad HEENT: no facial asymmetry. No edema. No cervical LAD. There is a 1cm round midline oronasal fistula present with erythema around the fistula rim. No signs of infection. Intranasal exam showing obliteration of nasal septum.  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 Recommend oro-nasal fistula closure to be completed in OR setting under general anesthesia.  May require multiple procedures for closure. R/B/A discussed.   Anesthesia request: general via oral RAE tube taped in midline.   Adam Gonzalez, DMD  Oral &  Maxillofacial Surgery 01/27/2020, 2:39 PM

## 2020-03-31 ENCOUNTER — Ambulatory Visit (HOSPITAL_BASED_OUTPATIENT_CLINIC_OR_DEPARTMENT_OTHER): Payer: Self-pay | Admitting: Certified Registered"

## 2020-03-31 ENCOUNTER — Other Ambulatory Visit: Payer: Self-pay

## 2020-03-31 ENCOUNTER — Encounter (HOSPITAL_BASED_OUTPATIENT_CLINIC_OR_DEPARTMENT_OTHER)
Admission: RE | Disposition: A | Discharge status: Home / Self Care | Payer: Self-pay | Source: Home / Self Care | Attending: Oral Surgery

## 2020-03-31 ENCOUNTER — Ambulatory Visit (HOSPITAL_BASED_OUTPATIENT_CLINIC_OR_DEPARTMENT_OTHER)
Admission: RE | Admit: 2020-03-31 | Discharge: 2020-03-31 | Disposition: A | Discharge status: Home / Self Care | Payer: Self-pay | Attending: Oral Surgery | Admitting: Oral Surgery

## 2020-03-31 ENCOUNTER — Encounter (HOSPITAL_BASED_OUTPATIENT_CLINIC_OR_DEPARTMENT_OTHER): Payer: Self-pay | Admitting: Oral Surgery

## 2020-03-31 DIAGNOSIS — F17290 Nicotine dependence, other tobacco product, uncomplicated: Secondary | ICD-10-CM | POA: Insufficient documentation

## 2020-03-31 DIAGNOSIS — F17210 Nicotine dependence, cigarettes, uncomplicated: Secondary | ICD-10-CM | POA: Insufficient documentation

## 2020-03-31 DIAGNOSIS — M278 Other specified diseases of jaws: Secondary | ICD-10-CM | POA: Insufficient documentation

## 2020-03-31 HISTORY — PX: PALATOPLASTY: SHX6000

## 2020-03-31 HISTORY — DX: Cleft palate, unspecified: Q35.9

## 2020-03-31 SURGERY — REPAIR, PALATE
Anesthesia: General | Site: Mouth

## 2020-03-31 MED ORDER — LIDOCAINE 2% (20 MG/ML) 5 ML SYRINGE
INTRAMUSCULAR | Status: AC
  Filled 2020-03-31: qty 5

## 2020-03-31 MED ORDER — LIDOCAINE-EPINEPHRINE 2 %-1:100000 IJ SOLN
INTRAMUSCULAR | Status: DC | PRN
  Administered 2020-03-31: 5 mL
  Administered 2020-03-31: 3 mL

## 2020-03-31 MED ORDER — ONDANSETRON HCL 4 MG/2ML IJ SOLN
INTRAMUSCULAR | Status: DC | PRN
  Administered 2020-03-31: 4 mg via INTRAVENOUS

## 2020-03-31 MED ORDER — DEXAMETHASONE SODIUM PHOSPHATE 10 MG/ML IJ SOLN
INTRAMUSCULAR | Status: AC
  Filled 2020-03-31: qty 1

## 2020-03-31 MED ORDER — PROPOFOL 10 MG/ML IV BOLUS
INTRAVENOUS | Status: AC
  Filled 2020-03-31: qty 40

## 2020-03-31 MED ORDER — SUGAMMADEX SODIUM 200 MG/2ML IV SOLN
INTRAVENOUS | Status: DC | PRN
  Administered 2020-03-31: 200 mg via INTRAVENOUS

## 2020-03-31 MED ORDER — MEPERIDINE HCL 25 MG/ML IJ SOLN: 6.2500 mg | INTRAMUSCULAR | Status: DC | PRN

## 2020-03-31 MED ORDER — ACETAMINOPHEN 160 MG/5ML PO SOLN: 325.0000 mg | Freq: Once | ORAL | Status: DC | PRN

## 2020-03-31 MED ORDER — LIDOCAINE-EPINEPHRINE 2 %-1:100000 IJ SOLN
INTRAMUSCULAR | Status: AC
  Filled 2020-03-31: qty 1

## 2020-03-31 MED ORDER — FENTANYL CITRATE (PF) 100 MCG/2ML IJ SOLN
INTRAMUSCULAR | Status: AC
  Filled 2020-03-31: qty 2

## 2020-03-31 MED ORDER — DEXAMETHASONE SODIUM PHOSPHATE 4 MG/ML IJ SOLN
INTRAMUSCULAR | Status: DC | PRN
  Administered 2020-03-31: 10 mg via INTRAVENOUS

## 2020-03-31 MED ORDER — FENTANYL CITRATE (PF) 100 MCG/2ML IJ SOLN: 25.0000 ug | INTRAMUSCULAR | Status: DC | PRN

## 2020-03-31 MED ORDER — MIDAZOLAM HCL 5 MG/5ML IJ SOLN
INTRAMUSCULAR | Status: DC | PRN
  Administered 2020-03-31: 2 mg via INTRAVENOUS

## 2020-03-31 MED ORDER — MIDAZOLAM HCL 2 MG/2ML IJ SOLN
INTRAMUSCULAR | Status: AC
  Filled 2020-03-31: qty 2

## 2020-03-31 MED ORDER — AMPICILLIN-SULBACTAM SODIUM 3 (2-1) G IJ SOLR
INTRAMUSCULAR | Status: AC
  Filled 2020-03-31: qty 8

## 2020-03-31 MED ORDER — AMOXICILLIN-POT CLAVULANATE 875-125 MG PO TABS: 1.0000 | ORAL_TABLET | Freq: Two times a day (BID) | ORAL | 0 refills | Status: AC

## 2020-03-31 MED ORDER — PHENYLEPHRINE HCL (PRESSORS) 10 MG/ML IV SOLN
INTRAVENOUS | Status: DC | PRN
  Administered 2020-03-31: 80 ug via INTRAVENOUS
  Administered 2020-03-31: 40 ug via INTRAVENOUS
  Administered 2020-03-31: 80 ug via INTRAVENOUS
  Administered 2020-03-31 (×6): 120 ug via INTRAVENOUS

## 2020-03-31 MED ORDER — ACETAMINOPHEN 325 MG PO TABS: 325.0000 mg | ORAL_TABLET | Freq: Once | ORAL | Status: DC | PRN

## 2020-03-31 MED ORDER — DEXAMETHASONE SODIUM PHOSPHATE 10 MG/ML IJ SOLN: 10.0000 mg | Freq: Once | INTRAMUSCULAR | Status: DC

## 2020-03-31 MED ORDER — HYDROCODONE-ACETAMINOPHEN 5-325 MG PO TABS: 1.0000 | ORAL_TABLET | ORAL | 0 refills | Status: AC | PRN

## 2020-03-31 MED ORDER — OXYCODONE HCL 5 MG PO TABS: 5.0000 mg | ORAL_TABLET | Freq: Once | ORAL | Status: DC | PRN

## 2020-03-31 MED ORDER — ROCURONIUM BROMIDE 10 MG/ML (PF) SYRINGE
PREFILLED_SYRINGE | INTRAVENOUS | Status: AC
  Filled 2020-03-31: qty 10

## 2020-03-31 MED ORDER — DEXMEDETOMIDINE (PRECEDEX) IN NS 20 MCG/5ML (4 MCG/ML) IV SYRINGE
PREFILLED_SYRINGE | INTRAVENOUS | Status: AC
  Filled 2020-03-31: qty 5

## 2020-03-31 MED ORDER — LACTATED RINGERS IV SOLN: INTRAVENOUS | Status: DC

## 2020-03-31 MED ORDER — AMISULPRIDE (ANTIEMETIC) 5 MG/2ML IV SOLN: 10.0000 mg | Freq: Once | INTRAVENOUS | Status: DC | PRN

## 2020-03-31 MED ORDER — ACETAMINOPHEN 10 MG/ML IV SOLN: 1000.0000 mg | Freq: Once | INTRAVENOUS | Status: DC | PRN

## 2020-03-31 MED ORDER — CHLORHEXIDINE GLUCONATE 0.12 % MT SOLN: 15.0000 mL | Freq: Three times a day (TID) | OROMUCOSAL | 0 refills | Status: AC

## 2020-03-31 MED ORDER — LIDOCAINE HCL (CARDIAC) PF 100 MG/5ML IV SOSY
PREFILLED_SYRINGE | INTRAVENOUS | Status: DC | PRN
  Administered 2020-03-31: 100 mg via INTRAVENOUS

## 2020-03-31 MED ORDER — FENTANYL CITRATE (PF) 100 MCG/2ML IJ SOLN
INTRAMUSCULAR | Status: DC | PRN
Start: 2020-03-31 — End: 2020-03-31
  Administered 2020-03-31: 100 ug via INTRAVENOUS

## 2020-03-31 MED ORDER — OXYCODONE HCL 5 MG/5ML PO SOLN: 5.0000 mg | Freq: Once | ORAL | Status: DC | PRN

## 2020-03-31 MED ORDER — ROCURONIUM BROMIDE 100 MG/10ML IV SOLN
INTRAVENOUS | Status: DC | PRN
  Administered 2020-03-31: 70 mg via INTRAVENOUS

## 2020-03-31 MED ORDER — PROPOFOL 10 MG/ML IV BOLUS
INTRAVENOUS | Status: DC | PRN
  Administered 2020-03-31: 150 mg via INTRAVENOUS

## 2020-03-31 MED ORDER — SODIUM CHLORIDE 0.9 % IV SOLN
3.0000 g | INTRAVENOUS | Status: AC
  Administered 2020-03-31: 3 g via INTRAVENOUS

## 2020-03-31 SURGICAL SUPPLY — 56 items
ALLODERM 4X16 MED THICK (Tissue) ×3 IMPLANT
APL SKNCLS STERI-STRIP NONHPOA (GAUZE/BANDAGES/DRESSINGS)
BENZOIN TINCTURE PRP APPL 2/3 (GAUZE/BANDAGES/DRESSINGS) IMPLANT
BLADE SURG 15 STRL LF DISP TIS (BLADE) ×1 IMPLANT
BLADE SURG 15 STRL SS (BLADE) ×9
BUR SURG 4X8 MED (BURR) IMPLANT
BURR SURG 4MMX8MM MEDIUM (BURR) ×1
BURR SURG 4X8 MED (BURR) ×2
CANISTER SUCT 1200ML W/VALVE (MISCELLANEOUS) ×3 IMPLANT
COVER BACK TABLE 60X90IN (DRAPES) ×3 IMPLANT
COVER MAYO STAND STRL (DRAPES) ×3 IMPLANT
COVER WAND RF STERILE (DRAPES) IMPLANT
DRAPE U-SHAPE 76X120 STRL (DRAPES) ×3 IMPLANT
ELECT COATED BLADE 2.86 ST (ELECTRODE) IMPLANT
ELECT REM PT RETURN 9FT ADLT (ELECTROSURGICAL) ×3
ELECTRODE REM PT RTRN 9FT ADLT (ELECTROSURGICAL) IMPLANT
GAUZE PACKING IODOFORM 1/4X15 (PACKING) IMPLANT
GLOVE BIO SURGEON STRL SZ 6.5 (GLOVE) ×3 IMPLANT
GLOVE BIO SURGEONS STRL SZ 6.5 (GLOVE) ×1
GLOVE BIOGEL PI IND STRL 7.0 (GLOVE) IMPLANT
GLOVE BIOGEL PI INDICATOR 7.0 (GLOVE) ×2
GLOVE ORTHO TXT STRL SZ7.5 (GLOVE) ×3 IMPLANT
GOWN STRL REUS W/ TWL LRG LVL3 (GOWN DISPOSABLE) ×2 IMPLANT
GOWN STRL REUS W/ TWL XL LVL3 (GOWN DISPOSABLE) ×1 IMPLANT
GOWN STRL REUS W/TWL LRG LVL3 (GOWN DISPOSABLE) ×3
GOWN STRL REUS W/TWL XL LVL3 (GOWN DISPOSABLE) ×3
HEMOSTAT SURGICEL 2X14 (HEMOSTASIS) ×2 IMPLANT
NDL BLUNT 17GA (NEEDLE) IMPLANT
NEEDLE BLUNT 17GA (NEEDLE) IMPLANT
NS IRRIG 1000ML POUR BTL (IV SOLUTION) ×3 IMPLANT
PACK BASIN DAY SURGERY FS (CUSTOM PROCEDURE TRAY) ×3 IMPLANT
PATTIES SURGICAL .5 X3 (DISPOSABLE) IMPLANT
PENCIL SMOKE EVACUATOR (MISCELLANEOUS) IMPLANT
SLEEVE SCD COMPRESS KNEE MED (MISCELLANEOUS) ×3 IMPLANT
SPONGE INTESTINAL PEANUT (DISPOSABLE) IMPLANT
SPONGE SURGIFOAM ABS GEL 12-7 (HEMOSTASIS) IMPLANT
STAPLER VISISTAT 35W (STAPLE) IMPLANT
SUCTION FRAZIER HANDLE 10FR (MISCELLANEOUS) ×2
SUCTION TUBE FRAZIER 10FR DISP (MISCELLANEOUS) ×1 IMPLANT
SUT CHROMIC 3 0 PS 2 (SUTURE) IMPLANT
SUT CHROMIC 4 0 P 3 18 (SUTURE) IMPLANT
SUT PROLENE 4 0 PS 2 18 (SUTURE) ×4 IMPLANT
SUT PROLENE 5 0 P 3 (SUTURE) IMPLANT
SUT SILK 3 0 PS 1 (SUTURE) IMPLANT
SUT VIC AB 4-0 P-3 18XBRD (SUTURE) IMPLANT
SUT VIC AB 4-0 P3 18 (SUTURE)
SUT VICRYL 4-0 PS2 18IN ABS (SUTURE) ×4 IMPLANT
SYR 50ML LL SCALE MARK (SYRINGE) ×2 IMPLANT
SYR BULB EAR ULCER 3OZ GRN STR (SYRINGE) ×1 IMPLANT
TISSUE ALLDRM 4X16 MED THICK (Tissue) IMPLANT
TOOTHBRUSH ADULT (PERSONAL CARE ITEMS) IMPLANT
TOWEL GREEN STERILE FF (TOWEL DISPOSABLE) ×6 IMPLANT
TRAY DSU PREP LF (CUSTOM PROCEDURE TRAY) ×2 IMPLANT
TUBE CONNECTING 20'X1/4 (TUBING) ×1
TUBE CONNECTING 20X1/4 (TUBING) ×2 IMPLANT
YANKAUER SUCT BULB TIP NO VENT (SUCTIONS) ×2 IMPLANT

## 2020-03-31 NOTE — Anesthesia Preprocedure Evaluation (Addendum)
Anesthesia Evaluation  Patient identified by MRN, date of birth, ID band Patient awake    Reviewed: Allergy & Precautions, NPO status , Patient's Chart, lab work & pertinent test results  Airway Mallampati: I  TM Distance: >3 FB Neck ROM: Full    Dental  (+) Edentulous Upper, Dental Advisory Given   Pulmonary Current Smoker,    breath sounds clear to auscultation       Cardiovascular negative cardio ROS   Rhythm:Regular Rate:Normal     Neuro/Psych negative neurological ROS  negative psych ROS   GI/Hepatic negative GI ROS, Neg liver ROS,   Endo/Other  negative endocrine ROS  Renal/GU negative Renal ROS     Musculoskeletal negative musculoskeletal ROS (+)   Abdominal Normal abdominal exam  (+)   Peds  Hematology negative hematology ROS (+)   Anesthesia Other Findings   Reproductive/Obstetrics                            Anesthesia Physical Anesthesia Plan  ASA: I  Anesthesia Plan: General   Post-op Pain Management:    Induction: Intravenous  PONV Risk Score and Plan: 2 and Ondansetron, Dexamethasone and Midazolam  Airway Management Planned: Oral ETT  Additional Equipment: None  Intra-op Plan:   Post-operative Plan: Extubation in OR  Informed Consent: I have reviewed the patients History and Physical, chart, labs and discussed the procedure including the risks, benefits and alternatives for the proposed anesthesia with the patient or authorized representative who has indicated his/her understanding and acceptance.     Dental advisory given  Plan Discussed with: CRNA  Anesthesia Plan Comments: (Pt does not want a nasal intubation due to multiple nasal fractures in the past. )       Anesthesia Quick Evaluation

## 2020-03-31 NOTE — H&P (Signed)
Adam Gonzalez is an 59 y.o. male.   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 Presents for surgical closure. C/o hypernasal speech, food/fluids getting into nose.  Past Medical History:  Diagnosis Date  . Medical history non-contributory   . Oronasal fistula     Past Surgical History:  Procedure Laterality Date  . INGUINAL HERNIA REPAIR Right   . INGUINAL HERNIA REPAIR Left 08/02/2015  . INGUINAL HERNIA REPAIR Left 08/02/2015   Procedure: HERNIA REPAIR INGUINAL ADULT;  Surgeon: Mickeal Skinner, MD;  Location: [place];  Service: General;  Laterality: Left;  . NASAL SINUS SURGERY      History reviewed. No pertinent family history. Social History:  reports that he has been smoking cigars and cigarettes. He has a 2.00 pack-year smoking history. He has never used smokeless tobacco. He reports current alcohol use of about 3.0 standard drinks of alcohol per week. He reports current drug use. Drug: Marijuana.  Allergies: No Known Allergies  Medications Prior to Admission  Medication Sig Dispense Refill  . acetaminophen (TYLENOL) 500 MG tablet Take 1 tablet (500 mg total) by mouth every 6 (six) hours as needed. 30 tablet 0    Results for orders placed or performed during the hospital encounter of 03/30/20 (from the past 48 hour(s))  SARS CORONAVIRUS 2 (TAT 6-24 HRS) Nasopharyngeal Nasopharyngeal Swab     Status: None   Collection Time: 03/30/20  8:20 AM   Specimen: Nasopharyngeal Swab  Result Value Ref Range   SARS Coronavirus 2 NEGATIVE NEGATIVE    Comment: (NOTE) SARS-CoV-2 target nucleic acids are NOT DETECTED.  The SARS-CoV-2 RNA is generally detectable in upper and lower respiratory specimens during the acute phase of infection. Negative results do not preclude SARS-CoV-2 infection, do not rule out co-infections with other pathogens, and should not be used as the sole basis for treatment or  other patient management decisions. Negative results must be combined with clinical observations, patient history, and epidemiological information. The expected result is Negative.  Fact Sheet for Patients: SugarRoll.be  Fact Sheet for Healthcare Providers: https://www.woods-mathews.com/  This test is not yet approved or cleared by the [place] FDA and  has been authorized for detection and/or diagnosis of SARS-CoV-2 by FDA under an Emergency Use Authorization (EUA). This EUA will remain  in effect (meaning this test can be used) for the duration of the COVID-19 declaration under Se ction 564(b)(1) of the Act, 21 U.S.C. section 360bbb-3(b)(1), unless the authorization is terminated or revoked sooner.  Performed at [facility], [place] [address], [place], [place] [address]    No results found.  Review of Systems: other than HPI, neg  Blood pressure 118/75, pulse (!) 58, temperature 97.7 F (36.5 C), temperature source Oral, resp. rate 18, height 5\' 8"  (1.727 m), weight 63 kg, SpO2 100 %. Physical Exam  Vitals: 83; 120/78, 16, 98% RA Gen: a&o, nad HEENT: no facial asymmetry. No edema. No cervical LAD. There is a 1cm round midline oronasal fistula present with erythema around the fistula rim. No signs of infection. Intranasal exam showing obliteration of nasal septum.  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 Recommend oro-nasal fistula closure to be completed in OR setting under general anesthesia.  May require multiple procedures for closure. R/B/A discussed.   Anesthesia request: general via oral RAE tube taped  in midline.   Michael Litter, DMD  Oral & Maxillofacial Surgery 01/27/2020, 8:27 AM

## 2020-03-31 NOTE — Op Note (Signed)
03/31/2020  10:14 AM  PATIENT:  Adam Gonzalez  59 y.o. male  PRE-OPERATIVE DIAGNOSIS:  ORONASAL FISTULA  POST-OPERATIVE DIAGNOSIS:  ORONASAL FISTULA  PROCEDURE:   1. Palatoplasty with use of rotational pedicle flaps.  SURGEON:  Surgeon(s) and Role:    * Verlisa Vara, DMD - Primary  ASSISTANTS: Jilda Panda   ANESTHESIA:   general  EBL:  69CV  Complications: none  Implants: Alloderm regenerative tissue matrix  Operative findings:  1. 1cm soft tissue oronasal fistula 2. approx 2cm bony fistula 3. Pedicle flap was pink and well perfused following procedure.  Procedure: Patient was identified in the preoperative holding by both anesthesia and the maxillofacial team.  Health history was reviewed.  Consent was verified.  The patient was then brought back to the operating room and placed in the table in the supine position.  Standard ASA leads and monitors were placed.  The patient was preoxygenated, induced, and his airway was protected with an oral RAE tube.  The tube was taped and secured in the midline by the anesthesia care team.  The patient was then prepped and draped for standard maxillofacial procedure.  The patient was then injected with 8 cc of 2% lidocaine with 1:100,000 epi in the bilateral posterior and anterior palate.  A throat pack was placed.  A 15 blade was then used to make a circumferential incision around the margin of the oronasal fistula.  This was completely excised and removed.  Partial thickness incisions were then made made posteriorly and anteriorly along the midline.  Once over bone the incision was carried down through the periosteum anteriorly, then laterally and posterior on the palatal aspect of the crest of the maxilla.  A full-thickness pedicle flap on the right palate was then elevated first in a subperiosteal plane.  Then as a split thickness near the fistula opening.  The pedicle flap incorporated the right greater palatine vasculature.  This was also  partially released to allow for rotation for full coverage of the oronasal communication.  On the left side of the fistula opening a split thickness tunnel was developed to aid with multilayer closure.  AlloDerm regenerative tissue matrix was first used and incorporated into the split-thickness pocket to provide 1 layer of closure on the nasal side. This was secured with multiple 4-0 Vicryl sutures across the communication.  The palatal pedicle flap was then rotated and secured across the fistula for primary closure on the oral side with multiple 4-0 Prolene sutures, to include 4-0 Prolene mattress sutures.  Following closure the pedicle flap was noted to be pink and well perfused.  There was adequate hemostasis.  The patient's oral cavity was thoroughly cleansed.  The throat pack was removed.  The patient was then returned to the anesthesia care team where he was extubated without event.  He was transported to the postanesthesia care unit for recovery, and will be discharged once meeting appropriate discharge criteria.  [name], DMD Oral & Maxillofacial Surgery

## 2020-03-31 NOTE — Brief Op Note (Signed)
03/31/2020  10:14 AM  PATIENT:  [name]  59 y.o. male  PRE-OPERATIVE DIAGNOSIS:  ORONASAL FISTULA  POST-OPERATIVE DIAGNOSIS:  ORONASAL FISTULA  PROCEDURE:   1. Palatoplasty with use of rotational pedicle flaps.  SURGEON:  Surgeon(s) and Role:    * Grettell Ransdell, Optician, dispensing, DMD - Primary  ASSISTANTS: Jilda Panda   ANESTHESIA:   general  EBL:  10cc  BLOOD ADMINISTERED:none  DRAINS: none   LOCAL MEDICATIONS USED:  LIDOCAINE   SPECIMEN:  No Specimen  DISPOSITION OF SPECIMEN:  N/A  COUNTS:  YES  TOURNIQUET:  * No tourniquets in log *  DICTATION: .Dragon Dictation  PLAN OF CARE: Discharge to home after PACU  PATIENT DISPOSITION:  PACU - hemodynamically stable.   Delay start of Pharmacological VTE agent (>24hrs) due to surgical blood loss or risk of bleeding: not applicable

## 2020-03-31 NOTE — Transfer of Care (Signed)
Immediate Anesthesia Transfer of Care Note  Patient: Adam Gonzalez  Procedure(s) Performed: PALATOPLASTY with VOMER FLAP (N/A Mouth)  Patient Location: PACU  Anesthesia Type:General  Level of Consciousness: awake, alert  and oriented  Airway & Oxygen Therapy: Patient Spontanous Breathing and Patient connected to face mask oxygen  Post-op Assessment: Report given to RN and Post -op Vital signs reviewed and stable  Post vital signs: Reviewed and stable  Last Vitals:  Vitals Value Taken Time  BP 115/76 03/31/20 1011  Temp    Pulse 73 03/31/20 1013  Resp 16 03/31/20 1013  SpO2 100 % 03/31/20 1013  Vitals shown include unvalidated device data.  Last Pain:  Vitals:   03/31/20 0717  TempSrc: Oral  PainSc: 0-No pain         Complications: No complications documented.

## 2020-03-31 NOTE — Anesthesia Procedure Notes (Addendum)
Procedure Name: Intubation Performed by: Verita Lamb, CRNA Pre-anesthesia Checklist: Patient identified, Emergency Drugs available, Suction available and Patient being monitored Patient Re-evaluated:Patient Re-evaluated prior to induction Oxygen Delivery Method: Circle system utilized Preoxygenation: Pre-oxygenation with 100% oxygen Induction Type: IV induction Ventilation: Mask ventilation without difficulty Laryngoscope Size: Mac and 4 Grade View: Grade I Tube type: Oral Rae Tube size: 8.0 mm Number of attempts: 1 Airway Equipment and Method: Stylet and Oral airway Placement Confirmation: ETT inserted through vocal cords under direct vision,  positive ETCO2 and breath sounds checked- equal and bilateral Secured at: 24 cm Tube secured with: Tape Dental Injury: Teeth and Oropharynx as per pre-operative assessment  Comments: Poor dentition, multiple missing and loose teeth preop.  Teeth and lips as preop after airway manipulation

## 2020-03-31 NOTE — Anesthesia Postprocedure Evaluation (Signed)
Anesthesia Post Note  Patient: Adam Gonzalez  Procedure(s) Performed: PALATOPLASTY with VOMER FLAP (N/A Mouth)     Patient location during evaluation: PACU Anesthesia Type: General Level of consciousness: awake and alert Pain management: pain level controlled Vital Signs Assessment: post-procedure vital signs reviewed and stable Respiratory status: spontaneous breathing, nonlabored ventilation, respiratory function stable and patient connected to nasal cannula oxygen Cardiovascular status: blood pressure returned to baseline and stable Postop Assessment: no apparent nausea or vomiting Anesthetic complications: no   No complications documented.  Last Vitals:  Vitals:   03/31/20 1030 03/31/20 1110  BP:  118/90  Pulse: 71 69  Resp: 14 16  Temp:  (!) 36.2 C  SpO2: 100% 100%    Last Pain:  Vitals:   03/31/20 1110  TempSrc:   PainSc: 0-No pain                 Effie Berkshire

## 2020-03-31 NOTE — Discharge Instructions (Signed)
OMS Postop instructions: 1. Sinus precautions to include: no smoking, no nose blowing, No straws, open mouth sneezes. 2. Soft, non-chew, pureed diet until further notice. 3. Rinse with Chlorhexidine rinses tid until further notice. 4. Do NOT wear denture at this time.    Post Anesthesia Home Care Instructions  Activity: Get plenty of rest for the remainder of the day. A responsible individual must stay with you for 24 hours following the procedure.  For the next 24 hours, DO NOT: -Drive a car -Paediatric nurse -Drink alcoholic beverages -Take any medication unless instructed by your physician -Make any legal decisions or sign important papers.  Meals: Start with liquid foods such as gelatin or soup. Progress to regular foods as tolerated. Avoid greasy, spicy, heavy foods. If nausea and/or vomiting occur, drink only clear liquids until the nausea and/or vomiting subsides. Call your physician if vomiting continues.  Special Instructions/Symptoms: Your throat may feel dry or sore from the anesthesia or the breathing tube placed in your throat during surgery. If this causes discomfort, gargle with warm salt water. The discomfort should disappear within 24 hours.  If you had a scopolamine patch placed behind your ear for the management of post- operative nausea and/or vomiting:  1. The medication in the patch is effective for 72 hours, after which it should be removed.  Wrap patch in a tissue and discard in the trash. Wash hands thoroughly with soap and water. 2. You may remove the patch earlier than 72 hours if you experience unpleasant side effects which may include dry mouth, dizziness or visual disturbances. 3. Avoid touching the patch. Wash your hands with soap and water after contact with the patch.

## 2020-04-05 ENCOUNTER — Encounter (HOSPITAL_BASED_OUTPATIENT_CLINIC_OR_DEPARTMENT_OTHER): Payer: Self-pay | Admitting: Oral Surgery

## 2020-10-07 ENCOUNTER — Other Ambulatory Visit: Payer: Self-pay

## 2020-10-07 ENCOUNTER — Ambulatory Visit: Payer: BLUE CROSS/BLUE SHIELD | Admitting: Nurse Practitioner

## 2020-10-07 ENCOUNTER — Encounter: Payer: Self-pay | Admitting: Nurse Practitioner

## 2020-10-07 VITALS — BP 118/80 | HR 105 | Temp 98.5°F | Ht 66.8 in | Wt 145.4 lb

## 2020-10-07 DIAGNOSIS — R972 Elevated prostate specific antigen [PSA]: Secondary | ICD-10-CM

## 2020-10-07 DIAGNOSIS — Z13228 Encounter for screening for other metabolic disorders: Secondary | ICD-10-CM

## 2020-10-07 DIAGNOSIS — Z23 Encounter for immunization: Secondary | ICD-10-CM

## 2020-10-07 DIAGNOSIS — Z7689 Persons encountering health services in other specified circumstances: Secondary | ICD-10-CM | POA: Diagnosis not present

## 2020-10-07 DIAGNOSIS — Z1159 Encounter for screening for other viral diseases: Secondary | ICD-10-CM | POA: Diagnosis not present

## 2020-10-07 DIAGNOSIS — R634 Abnormal weight loss: Secondary | ICD-10-CM

## 2020-10-07 DIAGNOSIS — R61 Generalized hyperhidrosis: Secondary | ICD-10-CM | POA: Diagnosis not present

## 2020-10-07 NOTE — Patient Instructions (Signed)
[phone] Dr. Buelah Manis (oral surgeon) [place], [place], [place] [address]

## 2020-10-07 NOTE — Progress Notes (Signed)
This visit occurred during the SARS-CoV-2 public health emergency.  Safety protocols were in place, including screening questions prior to the visit, additional usage of staff PPE, and extensive cleaning of exam room while observing appropriate contact time as indicated for disinfecting solutions.  Subjective:     Patient ID: Adam Gonzalez , male    DOB: 1960-12-18 , 60 y.o.   MRN: [id number]   Chief Complaint  Patient presents with  . Establish Care  . Weight Loss  . Headache    HPI  Patient presents today to establish primary care. He has a few concerns with his mouth, weight loss and also stated he has been having headaches. He had mouth surgery and has not been able to eat well. He has dentures that is not working well which has caused him to not be able to eat well. He has lost approximately 20 lbs over the last 2 years. He has mouth pain.  He does feel he still has an appetite. He works 7 days a week in [employer].    He has had prostate biopsies in the past but he did not go back due to insurance.    [facility] - mother - diabetes, father - healthy.  5 brothers and 2 sisters (one sister died in an accident).  Oldest brother - deceased - "broken heart"  He has quit drinking and smoking about 2 months ago  Headache  This is a chronic problem. The quality of the pain is described as aching (headache in the mornings). Pertinent negatives include no abdominal pain, nausea or vomiting.     Past Medical History:  Diagnosis Date  . Medical history non-contributory   . Oronasal fistula      Family History  Problem Relation Age of Onset  . Diabetes Mother   . Healthy Father   . Alzheimer's disease Maternal Grandmother      Current Outpatient Medications:  .  acetaminophen (TYLENOL) 500 MG tablet, Take 1 tablet (500 mg total) by mouth every 6 (six) hours as needed., Disp: 30 tablet, Rfl: 0 .  chlorhexidine (PERIDEX) 0.12 % solution, Use as directed 15 mLs in the mouth or throat 3  (three) times daily., Disp: 120 mL, Rfl: 0 .  HYDROcodone-acetaminophen (NORCO/VICODIN) 5-325 MG tablet, Take 1 tablet by mouth every 4 (four) hours as needed for moderate pain or severe pain., Disp: 16 tablet, Rfl: 0   No Known Allergies   Review of Systems  Constitutional: Positive for appetite change.  Respiratory: Negative.   Cardiovascular: Negative.  Negative for chest pain, palpitations and leg swelling.  Gastrointestinal: Negative.  Negative for abdominal distention, abdominal pain, constipation, diarrhea, nausea and vomiting.  Genitourinary: Negative for penile discharge, penile pain, penile swelling and urgency.       Erectile problems  Neurological: Positive for headaches.  Psychiatric/Behavioral: Negative.      Today's Vitals   10/07/20 1146  BP: 118/80  Pulse: (!) 105  Temp: 98.5 F (36.9 C)  Weight: 145 lb 6.4 oz (66 kg)  Height: 5' 6.8" (1.697 m)  PainSc: 0-No pain   Body mass index is 22.91 kg/m.   Objective:  Physical Exam Vitals reviewed.  Constitutional:      General: He is not in acute distress.    Appearance: Normal appearance. He is obese.  HENT:     Head: Normocephalic.  Cardiovascular:     Rate and Rhythm: Normal rate and regular rhythm.     Pulses: Normal pulses.  Heart sounds: Normal heart sounds. No murmur heard.   Pulmonary:     Effort: Pulmonary effort is normal. No respiratory distress.     Breath sounds: Normal breath sounds. No wheezing.  Musculoskeletal:        General: Normal range of motion.  Skin:    General: Skin is warm and dry.     Capillary Refill: Capillary refill takes less than 2 seconds.  Neurological:     General: No focal deficit present.     Mental Status: He is alert and oriented to person, place, and time.  Psychiatric:        Mood and Affect: Mood is anxious (seems anxious when asking questions about his health).        Behavior: Behavior normal.        Thought Content: Thought content normal.         Judgment: Judgment normal.         Assessment And Plan:     1. Night sweats  This is a new symptom in the last few months  Will check tb quantiferon  2. Weight loss  Reports he has lost approximately 20 lbs in the last 2 years.  I will check metabolic causes  I will also refer to GI for unknown weight loss - CMP14+EGFR - Hemoglobin A1c - Vitamin B12 - TSH - CBC - HIV Antibody (routine testing w rflx) - QuantiFERON-TB Gold Plus - Ambulatory referral to Gastroenterology  3. Elevated PSA  His PSA was elevated at 15 when he was seen at [facility], he had a biopsy done which was abnormal however he did not follow up.   Once I receive his results will bring him back to discuss his results  - PSA  4. Establishing care with new doctor, encounter for  5. Encounter for screening for metabolic disorder - Lipid panel - Hemoglobin A1c  6. Encounter for hepatitis C screening test for low risk patient  Will check Hepatitis C screening due to recent recommendations to screen all adults 18 years and older - Hepatitis C antibody  7. Encounter for immunization  Will give tetanus vaccine today while in office. Refer to order management. TDAP will be administered to adults 72-24 years old every 10 years. - Tdap vaccine greater than or equal to 7yo IM   After review of his records at [facility] he may have a diagnosis of Sarcoidosis. 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 I am not sure if the patient understood this as he repeats multiple times during the visit about the roof of his mouth having a problem after having his dentures.   Patient was given opportunity to ask questions. Patient verbalized understanding of the plan and was able to repeat key elements of the plan. All questions were answered to their satisfaction.  Minette Brine, FNP   I, Minette Brine, FNP, have  reviewed all documentation for this visit. The documentation on 10/07/20 for the exam, diagnosis, procedures, and orders are all accurate and complete.   IF YOU HAVE BEEN REFERRED TO A SPECIALIST, IT MAY TAKE 1-2 WEEKS TO SCHEDULE/PROCESS THE REFERRAL. IF YOU HAVE NOT HEARD FROM US/SPECIALIST IN TWO WEEKS, PLEASE GIVE [place] A CALL AT [phone].   THE PATIENT IS ENCOURAGED TO PRACTICE SOCIAL DISTANCING DUE TO THE COVID-19 PANDEMIC.

## 2020-10-10 LAB — CMP14+EGFR
ALT: 15 IU/L (ref 0–44)
AST: 23 IU/L (ref 0–40)
Albumin/Globulin Ratio: 1.4 (ref 1.2–2.2)
Albumin: 4.2 g/dL (ref 3.8–4.9)
Alkaline Phosphatase: 77 IU/L (ref 44–121)
BUN/Creatinine Ratio: 16 (ref 9–20)
BUN: 13 mg/dL (ref 6–24)
Bilirubin Total: 0.2 mg/dL (ref 0.0–1.2)
CO2: 24 mmol/L (ref 20–29)
Calcium: 9 mg/dL (ref 8.7–10.2)
Chloride: 95 mmol/L — ABNORMAL LOW (ref 96–106)
Creatinine, Ser: 0.82 mg/dL (ref 0.76–1.27)
Globulin, Total: 3.1 g/dL (ref 1.5–4.5)
Glucose: 83 mg/dL (ref 65–99)
Potassium: 4.6 mmol/L (ref 3.5–5.2)
Sodium: 140 mmol/L (ref 134–144)
Total Protein: 7.3 g/dL (ref 6.0–8.5)
eGFR: 102 mL/min/{1.73_m2} (ref >59)

## 2020-10-10 LAB — LIPID PANEL
Chol/HDL Ratio: 4.6 ratio (ref 0.0–5.0)
Cholesterol, Total: 151 mg/dL (ref 100–199)
HDL: 33 mg/dL — ABNORMAL LOW (ref >39)
LDL Chol Calc (NIH): 90 mg/dL (ref 0–99)
Triglycerides: 162 mg/dL — ABNORMAL HIGH (ref 0–149)
VLDL Cholesterol Cal: 28 mg/dL (ref 5–40)

## 2020-10-10 LAB — CBC
Hematocrit: 43.6 % (ref 37.5–51.0)
Hemoglobin: 14.3 g/dL (ref 13.0–17.7)
MCH: 27.2 pg (ref 26.6–33.0)
MCHC: 32.8 g/dL (ref 31.5–35.7)
MCV: 83 fL (ref 79–97)
Platelets: 484 10*3/uL — ABNORMAL HIGH (ref 150–450)
RBC: 5.25 x10E6/uL (ref 4.14–5.80)
RDW: 12.1 % (ref 11.6–15.4)
WBC: 2.9 10*3/uL — ABNORMAL LOW (ref 3.4–10.8)

## 2020-10-10 LAB — QUANTIFERON-TB GOLD PLUS
QuantiFERON Mitogen Value: 2.82 IU/mL
QuantiFERON Nil Value: 0.16 IU/mL
QuantiFERON TB1 Ag Value: 0.11 IU/mL
QuantiFERON TB2 Ag Value: 0.11 IU/mL
QuantiFERON-TB Gold Plus: NEGATIVE

## 2020-10-10 LAB — HEPATITIS C ANTIBODY: Hep C Virus Ab: <0.1 s/co ratio (ref 0.0–0.9)

## 2020-10-10 LAB — PSA: Prostate Specific Ag, Serum: 26.6 ng/mL — ABNORMAL HIGH (ref 0.0–4.0)

## 2020-10-10 LAB — HIV ANTIBODY (ROUTINE TESTING W REFLEX): HIV Screen 4th Generation wRfx: NONREACTIVE

## 2020-10-10 LAB — HEMOGLOBIN A1C
Est. average glucose Bld gHb Est-mCnc: 128 mg/dL
Hgb A1c MFr Bld: 6.1 % — ABNORMAL HIGH (ref 4.8–5.6)

## 2020-10-10 LAB — VITAMIN B12: Vitamin B-12: 945 pg/mL (ref 232–1245)

## 2020-10-10 LAB — TSH: TSH: 1.32 u[IU]/mL (ref 0.450–4.500)

## 2020-10-11 ENCOUNTER — Other Ambulatory Visit: Payer: Self-pay

## 2020-10-11 ENCOUNTER — Encounter: Payer: Self-pay | Admitting: Nurse Practitioner

## 2020-10-11 ENCOUNTER — Ambulatory Visit: Payer: BLUE CROSS/BLUE SHIELD | Admitting: Nurse Practitioner

## 2020-10-11 VITALS — BP 118/80 | HR 92 | Temp 98.2°F | Ht 68.2 in | Wt 147.4 lb

## 2020-10-11 DIAGNOSIS — M950 Acquired deformity of nose: Secondary | ICD-10-CM

## 2020-10-11 DIAGNOSIS — R634 Abnormal weight loss: Secondary | ICD-10-CM | POA: Diagnosis not present

## 2020-10-11 DIAGNOSIS — R972 Elevated prostate specific antigen [PSA]: Secondary | ICD-10-CM | POA: Diagnosis not present

## 2020-10-11 NOTE — Patient Instructions (Signed)
Dr Buelah Manis is an oral surgeon [place], [place], [place] [address] Hours:  Open ? Closes [place]: Appointment required  Mask required  Temperature check required  Staff wear masks  Staff get temperature checks  Staff required to disinfect surfaces between visits  More details Phone: [phone]

## 2020-10-11 NOTE — Progress Notes (Signed)
I,Yamilka Roman [employer] as a Education administrator for [employer], FNP.,have documented all relevant documentation on the behalf of Minette Brine, FNP,as directed by  Minette Brine, FNP while in the presence of Minette Brine, [place].This visit occurred during the SARS-CoV-2 public health emergency.  Safety protocols were in place, including screening questions prior to the visit, additional usage of staff PPE, and extensive cleaning of exam room while observing appropriate contact time as indicated for disinfecting solutions.  Subjective:     Patient ID: Adam Gonzalez , male    DOB: 27-Mar-1961 , 60 y.o.   MRN: [id number]   Chief Complaint  Patient presents with  . discuss labs    HPI  Here to discuss labs    Past Medical History:  Diagnosis Date  . Medical history non-contributory   . Oronasal fistula      Family History  Problem Relation Age of Onset  . Diabetes Mother   . Healthy Father   . Alzheimer's disease Maternal Grandmother      Current Outpatient Medications:  .  acetaminophen (TYLENOL) 500 MG tablet, Take 1 tablet (500 mg total) by mouth every 6 (six) hours as needed., Disp: 30 tablet, Rfl: 0 .  chlorhexidine (PERIDEX) 0.12 % solution, Use as directed 15 mLs in the mouth or throat 3 (three) times daily., Disp: 120 mL, Rfl: 0 .  HYDROcodone-acetaminophen (NORCO/VICODIN) 5-325 MG tablet, Take 1 tablet by mouth every 4 (four) hours as needed for moderate pain or severe pain., Disp: 16 tablet, Rfl: 0   No Known Allergies   Review of Systems  Constitutional: Positive for fatigue and unexpected weight change.  Respiratory: Negative.   Cardiovascular: Negative.   Psychiatric/Behavioral: Negative.      Today's Vitals   10/11/20 1037  BP: 118/80  Pulse: 92  Temp: 98.2 F (36.8 C)  TempSrc: Oral  Weight: 147 lb 6.4 oz (66.9 kg)  Height: 5' 8.2" (1.732 m)  PainSc: 0-No pain   Body mass index is 22.28 kg/m.   Objective:  Physical Exam Constitutional:      General: He  is not in acute distress.    Appearance: Normal appearance.  HENT:     Nose:     Comments: Nose has a bulging noted to tip, unsure of the cause. Soft and can see some of the vascular.  Neurological:     Mental Status: He is alert and oriented to person, place, and time.     Cranial Nerves: No cranial nerve deficit.     Motor: No weakness.  Psychiatric:        Mood and Affect: Mood normal.        Behavior: Behavior normal.        Thought Content: Thought content normal.        Judgment: Judgment normal.         Assessment And Plan:     1. Elevated PSA  Reviewed his most recent labs and his PSA level is up to 26.6 was 15.9 in July 2020, he had a biopsy which showed prostatic adenocarcinoma.  I am referring him to Dr. Raynelle Bring for further evaluation.  I have discussed with him he will need to see the Urologist to find out next steps. Due to his increased PSA I may him aware I am concerned he definitely has prostate cancer or has spread.  - Ambulatory referral to Urology  2. Abnormal weight loss - Ambulatory referral to Urology   3. Nose deformity He has a soft  bulging to his nose that appears irregular. He does have a history of fractured nose. Will refer to ENT.    Patient was given opportunity to ask questions. Patient verbalized understanding of the plan and was able to repeat key elements of the plan. All questions were answered to their satisfaction.  Minette Brine, FNP   I, Minette Brine, FNP, have reviewed all documentation for this visit. The documentation on 10/11/20 for the exam, diagnosis, procedures, and orders are all accurate and complete.   IF YOU HAVE BEEN REFERRED TO A SPECIALIST, IT MAY TAKE 1-2 WEEKS TO SCHEDULE/PROCESS THE REFERRAL. IF YOU HAVE NOT HEARD FROM US/SPECIALIST IN TWO WEEKS, PLEASE GIVE [place] A CALL AT [phone].   THE PATIENT IS ENCOURAGED TO PRACTICE SOCIAL DISTANCING DUE TO THE COVID-19 PANDEMIC.

## 2020-10-15 DIAGNOSIS — C61 Malignant neoplasm of prostate: Secondary | ICD-10-CM | POA: Diagnosis not present

## 2020-10-18 ENCOUNTER — Other Ambulatory Visit: Payer: Self-pay | Admitting: Urology

## 2020-10-18 DIAGNOSIS — C61 Malignant neoplasm of prostate: Secondary | ICD-10-CM

## 2020-11-07 ENCOUNTER — Ambulatory Visit
Admission: RE | Admit: 2020-11-07 | Discharge: 2020-11-07 | Disposition: A | Discharge status: Home / Self Care | Payer: BLUE CROSS/BLUE SHIELD | Source: Ambulatory Visit | Attending: Urology | Admitting: Urology

## 2020-11-07 ENCOUNTER — Other Ambulatory Visit: Payer: Self-pay

## 2020-11-07 DIAGNOSIS — C61 Malignant neoplasm of prostate: Secondary | ICD-10-CM | POA: Diagnosis not present

## 2020-11-07 DIAGNOSIS — N509 Disorder of male genital organs, unspecified: Secondary | ICD-10-CM | POA: Diagnosis not present

## 2020-11-07 DIAGNOSIS — C7951 Secondary malignant neoplasm of bone: Secondary | ICD-10-CM | POA: Diagnosis not present

## 2020-11-07 DIAGNOSIS — R972 Elevated prostate specific antigen [PSA]: Secondary | ICD-10-CM | POA: Diagnosis not present

## 2020-11-07 MED ORDER — GADOBENATE DIMEGLUMINE 529 MG/ML IV SOLN
15.0000 mL | Freq: Once | INTRAVENOUS | Status: AC | PRN
  Administered 2020-11-07: 15 mL via INTRAVENOUS

## 2020-11-08 ENCOUNTER — Ambulatory Visit (INDEPENDENT_AMBULATORY_CARE_PROVIDER_SITE_OTHER): Payer: BLUE CROSS/BLUE SHIELD | Admitting: Otolaryngology

## 2020-11-15 ENCOUNTER — Encounter: Payer: BLUE CROSS/BLUE SHIELD | Admitting: Nurse Practitioner

## 2020-11-23 ENCOUNTER — Ambulatory Visit: Payer: BLUE CROSS/BLUE SHIELD | Admitting: Nurse Practitioner

## 2020-11-24 ENCOUNTER — Other Ambulatory Visit: Payer: Self-pay

## 2020-11-24 ENCOUNTER — Encounter: Payer: Self-pay | Admitting: Nurse Practitioner

## 2020-11-24 ENCOUNTER — Ambulatory Visit (INDEPENDENT_AMBULATORY_CARE_PROVIDER_SITE_OTHER): Payer: BLUE CROSS/BLUE SHIELD | Admitting: Nurse Practitioner

## 2020-11-24 VITALS — BP 114/80 | HR 103 | Temp 98.0°F | Ht 68.2 in | Wt 143.2 lb

## 2020-11-24 DIAGNOSIS — Z0000 Encounter for general adult medical examination without abnormal findings: Secondary | ICD-10-CM | POA: Diagnosis not present

## 2020-11-24 DIAGNOSIS — H6123 Impacted cerumen, bilateral: Secondary | ICD-10-CM

## 2020-11-24 DIAGNOSIS — R7309 Other abnormal glucose: Secondary | ICD-10-CM

## 2020-11-24 DIAGNOSIS — E7800 Pure hypercholesterolemia, unspecified: Secondary | ICD-10-CM

## 2020-11-24 DIAGNOSIS — R972 Elevated prostate specific antigen [PSA]: Secondary | ICD-10-CM

## 2020-11-24 NOTE — Patient Instructions (Addendum)

## 2020-11-24 NOTE — Progress Notes (Signed)
I,Yamilka Roman [employer] as a Education administrator for [employer], FNP.,have documented all relevant documentation on the behalf of Minette Brine, FNP,as directed by  Minette Brine, FNP while in the presence of Minette Brine, [place]. This visit occurred during the SARS-CoV-2 public health emergency.  Safety protocols were in place, including screening questions prior to the visit, additional usage of staff PPE, and extensive cleaning of exam room while observing appropriate contact time as indicated for disinfecting solutions.  Subjective:     Patient ID: Adam Gonzalez , male    DOB: Jul 30, 1960 , 60 y.o.   MRN: [id number]   Chief Complaint  Patient presents with  . Annual Exam    HPI  Patient here for hm.  He feels like the denture glue he was using was causing headaches and feeling tired, once he stopped using he feels this has improved. He is now using some he got at the store.  He is scheduled for a biopsy on Friday at 2:30pm.  He was scheduled to see Dr. Lucia Gaskins on 4/25 but missed the appt he would like to focus on his prostate at this time  Wt Readings from Last 3 Encounters: 11/24/20 : 143 lb 3.2 oz (65 kg) 10/11/20 : 147 lb 6.4 oz (66.9 kg) 10/07/20 : 145 lb 6.4 oz (66 kg)    Past Medical History:  Diagnosis Date  . Medical history non-contributory   . Oronasal fistula      Family History  Problem Relation Age of Onset  . Diabetes Mother   . Healthy Father   . Alzheimer's disease Maternal Grandmother      Current Outpatient Medications:  .  chlorhexidine (PERIDEX) 0.12 % solution, Use as directed 15 mLs in the mouth or throat 3 (three) times daily. (Patient not taking: Reported on 11/24/2020), Disp: 120 mL, Rfl: 0 .  HYDROcodone-acetaminophen (NORCO/VICODIN) 5-325 MG tablet, Take 1 tablet by mouth every 4 (four) hours as needed for moderate pain or severe pain. (Patient not taking: Reported on 11/24/2020), Disp: 16 tablet, Rfl: 0   No Known Allergies   Men's preventive visit.  Patient Health Questionnaire (PHQ-2) is  [place] Office Visit from 10/07/2020 in [facility]  PHQ-2 Total Score 0     Patient is on a regular diet.  Exercising minimally. 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  Marital status: Single. Relevant history for alcohol use is:  Social History   Substance and Sexual Activity  Alcohol Use Yes  . Alcohol/week: 3.0 standard drinks  . Types: 3 Cans of beer per week   Comment: ocasionally    Relevant history for tobacco use is:  Social History   Tobacco Use  Smoking Status Former Smoker  . Packs/day: 0.25  . Years: 8.00  . Pack years: 2.00  . Types: Cigars, Cigarettes  Smokeless Tobacco Never Used  Tobacco Comment   black and milds (when drinking at times)  .   Review of Systems  Constitutional: Positive for unexpected weight change.  HENT: Negative.   Eyes: Negative.   Respiratory: Negative.   Cardiovascular: Negative.  Negative for chest pain, palpitations and leg swelling.  Gastrointestinal: Negative.   Endocrine: Negative.   Genitourinary: Negative.  Negative for flank pain, frequency and urgency.       Nocturia  Musculoskeletal: Negative.  Negative for arthralgias.  Skin: Positive for rash (right thigh dry scaly skin).  Neurological: Negative.  Negative for dizziness and headaches.  Hematological: Negative.   Psychiatric/Behavioral: Negative.  Today's Vitals   11/24/20 1403  BP: 114/80  Pulse: (!) 103  Temp: 98 F (36.7 C)  TempSrc: Oral  Weight: 143 lb 3.2 oz (65 kg)  Height: 5' 8.2" (1.732 m)  PainSc: 0-No pain   Body mass index is 21.65 kg/m.   Objective:  Physical Exam Vitals reviewed.  Constitutional:      General: He is not in acute distress.    Appearance: Normal appearance.  HENT:     Head: Normocephalic and atraumatic.     Right Ear: Tympanic membrane, ear canal and external ear normal. There is no impacted cerumen.     Left Ear: Tympanic membrane, ear canal and  external ear normal. There is no impacted cerumen.     Nose:     Comments: Deferred - masked    Mouth/Throat:     Comments: Deferred - masked Cardiovascular:     Rate and Rhythm: Normal rate and regular rhythm.     Pulses: Normal pulses.     Heart sounds: Normal heart sounds. No murmur heard.   Pulmonary:     Effort: Pulmonary effort is normal. No respiratory distress.     Breath sounds: Normal breath sounds. No wheezing.  Abdominal:     General: Abdomen is flat. Bowel sounds are normal. There is no distension.     Palpations: Abdomen is soft.     Tenderness: There is no abdominal tenderness.  Genitourinary:    Rectum: Guaiac result negative.  Musculoskeletal:        General: No swelling or tenderness. Normal range of motion.     Cervical back: Normal range of motion and neck supple.  Skin:    General: Skin is warm.     Capillary Refill: Capillary refill takes less than 2 seconds.  Neurological:     General: No focal deficit present.     Mental Status: He is alert and oriented to person, place, and time.     Cranial Nerves: No cranial nerve deficit.     Motor: No weakness.  Psychiatric:        Mood and Affect: Mood normal.        Behavior: Behavior normal.        Thought Content: Thought content normal.        Judgment: Judgment normal.         Assessment And Plan:    1. Encounter for general adult medical examination w/o abnormal findings . Behavior modifications discussed and diet history reviewed.   . Pt will continue to exercise regularly and modify diet with low GI, plant based foods and decrease intake of processed foods.  . Recommend intake of daily multivitamin, Vitamin D, and calcium.  . Recommend colonoscopy (I have given him the number for Dr. Hilarie Fredrickson) for preventive screenings, as well as recommend immunizations that include influenza, TDAP, will hold on shingles as he is in the process of having a prostate biopsy.   2. Bilateral impacted cerumen  Water  lavage done with good results  3. Elevated PSA  He is scheduled for a biopsy on Friday  I did explain to him the Urologist and I are concerned he has prostate cancer  I also explained to him getting the biopsy will help to identify better and see if has spread.   4. Abnormal glucose  Chronic, controlled  Continue with current medications  Encouraged to limit intake of sugary foods and drinks  Encouraged to increase physical activity to 150 minutes per week  5. Elevated cholesterol  Chronic, controlled  no current medications    Patient was given opportunity to ask questions. Patient verbalized understanding of the plan and was able to repeat key elements of the plan. All questions were answered to their satisfaction.   Minette Brine, FNP   I, Minette Brine, FNP, have reviewed all documentation for this visit. The documentation on 11/24/20 for the exam, diagnosis, procedures, and orders are all accurate and complete.  THE PATIENT IS ENCOURAGED TO PRACTICE SOCIAL DISTANCING DUE TO THE COVID-19 PANDEMIC.

## 2020-11-25 ENCOUNTER — Ambulatory Visit: Payer: Self-pay | Admitting: Nurse Practitioner

## 2020-11-26 DIAGNOSIS — N4232 Atypical small acinar proliferation of prostate: Secondary | ICD-10-CM | POA: Diagnosis not present

## 2020-11-26 DIAGNOSIS — C61 Malignant neoplasm of prostate: Secondary | ICD-10-CM | POA: Diagnosis not present

## 2020-11-30 ENCOUNTER — Other Ambulatory Visit: Payer: Self-pay

## 2020-11-30 DIAGNOSIS — R21 Rash and other nonspecific skin eruption: Secondary | ICD-10-CM

## 2020-11-30 MED ORDER — TRIAMCINOLONE ACETONIDE 0.025 % EX CREA: 1.0000 "application " | TOPICAL_CREAM | Freq: Two times a day (BID) | CUTANEOUS | 1 refills | Status: AC

## 2020-12-02 DIAGNOSIS — Z1152 Encounter for screening for COVID-19: Secondary | ICD-10-CM | POA: Diagnosis not present

## 2020-12-02 DIAGNOSIS — Z9189 Other specified personal risk factors, not elsewhere classified: Secondary | ICD-10-CM | POA: Diagnosis not present

## 2020-12-09 ENCOUNTER — Other Ambulatory Visit (HOSPITAL_COMMUNITY): Payer: Self-pay | Admitting: Urology

## 2020-12-09 DIAGNOSIS — C61 Malignant neoplasm of prostate: Secondary | ICD-10-CM

## 2021-01-26 ENCOUNTER — Ambulatory Visit (HOSPITAL_COMMUNITY): Payer: BLUE CROSS/BLUE SHIELD

## 2021-03-14 ENCOUNTER — Ambulatory Visit (HOSPITAL_COMMUNITY): Payer: BLUE CROSS/BLUE SHIELD

## 2021-03-14 ENCOUNTER — Encounter (HOSPITAL_COMMUNITY): Payer: Self-pay

## 2021-09-02 DIAGNOSIS — C61 Malignant neoplasm of prostate: Secondary | ICD-10-CM | POA: Diagnosis not present

## 2021-09-19 ENCOUNTER — Ambulatory Visit: Payer: BLUE CROSS/BLUE SHIELD | Admitting: Nurse Practitioner

## 2021-11-29 ENCOUNTER — Encounter: Payer: BLUE CROSS/BLUE SHIELD | Admitting: Nurse Practitioner

## 2021-11-29 NOTE — Progress Notes (Signed)
No show

## 2021-11-29 NOTE — Patient Instructions (Signed)
Health Maintenance, Male Adopting a healthy lifestyle and getting preventive care are important in promoting health and wellness. Ask your health care provider about: The right schedule for you to have regular tests and exams. Things you can do on your own to prevent diseases and keep yourself healthy. What should I know about diet, weight, and exercise? Eat a healthy diet  Eat a diet that includes plenty of vegetables, fruits, low-fat dairy products, and lean protein. Do not eat a lot of foods that are high in solid fats, added sugars, or sodium. Maintain a healthy weight Body mass index (BMI) is a measurement that can be used to identify possible weight problems. It estimates body fat based on height and weight. Your health care provider can help determine your BMI and help you achieve or maintain a healthy weight. Get regular exercise Get regular exercise. This is one of the most important things you can do for your health. Most adults should: Exercise for at least 150 minutes each week. The exercise should increase your heart rate and make you sweat (moderate-intensity exercise). Do strengthening exercises at least twice a week. This is in addition to the moderate-intensity exercise. Spend less time sitting. Even light physical activity can be beneficial. Watch cholesterol and blood lipids Have your blood tested for lipids and cholesterol at 60 years of age, then have this test every 5 years. You may need to have your cholesterol levels checked more often if: Your lipid or cholesterol levels are high. You are older than 60 years of age. You are at high risk for heart disease. What should I know about cancer screening? Many types of cancers can be detected early and may often be prevented. Depending on your health history and family history, you may need to have cancer screening at various ages. This may include screening for: Colorectal cancer. Prostate cancer. Skin cancer. Lung  cancer. What should I know about heart disease, diabetes, and high blood pressure? Blood pressure and heart disease High blood pressure causes heart disease and increases the risk of stroke. This is more likely to develop in people who have high blood pressure readings or are overweight. Talk with your health care provider about your target blood pressure readings. Have your blood pressure checked: Every 3-5 years if you are 18-39 years of age. Every year if you are 40 years old or older. If you are between the ages of 65 and 75 and are a current or former smoker, ask your health care provider if you should have a one-time screening for abdominal aortic aneurysm (AAA). Diabetes Have regular diabetes screenings. This checks your fasting blood sugar level. Have the screening done: Once every three years after age 45 if you are at a normal weight and have a low risk for diabetes. More often and at a younger age if you are overweight or have a high risk for diabetes. What should I know about preventing infection? Hepatitis B If you have a higher risk for hepatitis B, you should be screened for this virus. Talk with your health care provider to find out if you are at risk for hepatitis B infection. Hepatitis C Blood testing is recommended for: Everyone born from 1945 through 1965. Anyone with known risk factors for hepatitis C. Sexually transmitted infections (STIs) You should be screened each year for STIs, including gonorrhea and chlamydia, if: You are sexually active and are younger than 60 years of age. You are older than 60 years of age and your   health care provider tells you that you are at risk for this type of infection. Your sexual activity has changed since you were last screened, and you are at increased risk for chlamydia or gonorrhea. Ask your health care provider if you are at risk. Ask your health care provider about whether you are at high risk for HIV. Your health care provider  may recommend a prescription medicine to help prevent HIV infection. If you choose to take medicine to prevent HIV, you should first get tested for HIV. You should then be tested every 3 months for as long as you are taking the medicine. Follow these instructions at home: Alcohol use Do not drink alcohol if your health care provider tells you not to drink. If you drink alcohol: Limit how much you have to 0-2 drinks a day. Know how much alcohol is in your drink. In the [place] one drink equals one 12 oz bottle of beer (355 mL), one 5 oz glass of wine (148 mL), or one 1 oz glass of hard liquor (44 mL). Lifestyle Do not use any products that contain nicotine or tobacco. These products include cigarettes, chewing tobacco, and vaping devices, such as e-cigarettes. If you need help quitting, ask your health care provider. Do not use street drugs. Do not share needles. Ask your health care provider for help if you need support or information about quitting drugs. General instructions Schedule regular health, dental, and eye exams. Stay current with your vaccines. Tell your health care provider if: You often feel depressed. You have ever been abused or do not feel safe at home. Summary Adopting a healthy lifestyle and getting preventive care are important in promoting health and wellness. Follow your health care provider's instructions about healthy diet, exercising, and getting tested or screened for diseases. Follow your health care provider's instructions on monitoring your cholesterol and blood pressure. This information is not intended to replace advice given to you by your health care provider. Make sure you discuss any questions you have with your health care provider. Document Revised: 11/22/2020 Document Reviewed: 11/22/2020 Elsevier Patient Education  2023 Elsevier Inc.  

## 2022-05-11 ENCOUNTER — Telehealth: Payer: Self-pay | Admitting: Licensed Clinical Social Worker

## 2022-05-17 NOTE — Patient Outreach (Signed)
  Care Coordination   Initial Visit Note   05/17/2022 Name: Adam Gonzalez MRN: [id number] DOB: 1960/08/10  Adam Gonzalez is a 61 y.o. year old male who sees Minette Brine, [place] for primary care. I spoke with  [name] by phone today.  What matters to the patients health and wellness today?  Financial Strain    Goals Addressed             This Visit's Progress    Obtain Assistance with Benefits   On track    Care Coordination Interventions: Solution-Focused Strategies employed:  Active listening / Reflection utilized  Emotional Support Provided Patient reports that he recently completed community services hours with [employer]. He is experiencing difficulty logging in to Unemployment website to obtain benefits due to password issues Patient endorses ongoing financial strain and is experiencing feelings of overwhelm about the possibility of funds being delayed Validation and encouragement provided Patient receives emotional support from adult children LCSW assisted pt in identifying healthy coping skills LCSW assisted patient while on a conference call with [employer] Help Desk. Pt will obtain an email, with an IT representative to provide additional assistance with password support            SDOH assessments and interventions completed:  No     Care Coordination Interventions Activated:  Yes  Care Coordination Interventions:  Yes, provided   Follow up plan: Follow up call scheduled for 1 week    Encounter Outcome:  Pt. Visit Completed   Christa See, MSW, [email or web address] Phone [phone] 6:18 PM

## 2022-05-17 NOTE — Patient Instructions (Signed)
Visit Information  Thank you for taking time to visit with me today. Please don't hesitate to contact me if I can be of assistance to you.   Following are the goals we discussed today:   Goals Addressed             This Visit's Progress    Obtain Assistance with Benefits   On track    Care Coordination Interventions: Solution-Focused Strategies employed:  Active listening / Reflection utilized  Emotional Support Provided Patient reports that he recently completed community services hours with [employer]. He is experiencing difficulty logging in to Unemployment website to obtain benefits due to password issues Patient endorses ongoing financial strain and is experiencing feelings of overwhelm about the possibility of funds being delayed Validation and encouragement provided Patient receives emotional support from adult children LCSW assisted pt in identifying healthy coping skills LCSW assisted patient while on a conference call with [employer] Help Desk. Pt will obtain an email, with an IT representative to provide additional assistance with password support            If you are experiencing a Mental Health or [place] or need someone to talk to, please call the Suicide and Crisis Lifeline: 988 call 911   The patient verbalized understanding of instructions, educational materials, and care plan provided today and DECLINED offer to receive copy of patient instructions, educational materials, and care plan.   Christa See, MSW, [email or web address] Phone [phone] 6:19 PM

## 2022-05-18 ENCOUNTER — Telehealth: Payer: Self-pay | Admitting: Licensed Clinical Social Worker

## 2022-05-18 DIAGNOSIS — Z789 Other specified health status: Secondary | ICD-10-CM

## 2022-05-18 NOTE — Patient Instructions (Signed)
Visit Information  Thank you for taking time to visit with me today. Please don't hesitate to contact me if I can be of assistance to you.   Following are the goals we discussed today:   Goals Addressed             This Visit's Progress    Obtain Assistance with Benefits   On track    Care Coordination Interventions: Solution-Focused Strategies employed:  Active listening / Reflection utilized  Emotional Support Provided Patient received assistance resetting password. His unemployment benefits will resume; however, won't come for 2-3 weeks Patient reports stress regarding his inability to pay for rent this month. States that additional fees will be added after 05/21/22 LCSW placed an urgent referral to Care Guide for assistance and will follow up with patient within a week            Our next appointment is by telephone on 11/08 at 1 PM  Please call the care guide team at [phone] if you need to cancel or reschedule your appointment.   If you are experiencing a Mental Health or [place] or need someone to talk to, please call the Suicide and Crisis Lifeline: 988 call 911   The patient verbalized understanding of instructions, educational materials, and care plan provided today and DECLINED offer to receive copy of patient instructions, educational materials, and care plan.   Christa See, MSW, [name] Phone [phone] 4:36 PM

## 2022-05-18 NOTE — Patient Outreach (Signed)
  Care Coordination   Follow Up Visit Note   05/18/2022 Name: Bernadette Armijo MRN: [id number] DOB: April 09, 1961  Gracen Southwell is a 61 y.o. year old male who sees Minette Brine, [place] for primary care. I spoke with  [name] by phone today.  What matters to the patients health and wellness today?  Rental Assistance    Goals Addressed             This Visit's Progress    Obtain Assistance with Benefits   On track    Care Coordination Interventions: Solution-Focused Strategies employed:  Active listening / Reflection utilized  Emotional Support Provided Patient received assistance resetting password. His unemployment benefits will resume; however, won't come for 2-3 weeks Patient reports stress regarding his inability to pay for rent this month. States that additional fees will be added after 05/21/22 LCSW placed an urgent referral to Care Guide for assistance and will follow up with patient within a week            SDOH assessments and interventions completed:  Yes  SDOH Interventions Today    Flowsheet Row Most Recent Value  SDOH Interventions   Housing Interventions Ambulatory [id number] Order        Care Coordination Interventions Activated:  Yes  Care Coordination Interventions:  Yes, provided   Follow up plan: Follow up call scheduled for 11/08    Encounter Outcome:  Pt. Visit Completed   Christa See, MSW, [email or web address] Phone [phone] 4:36 PM

## 2022-05-19 ENCOUNTER — Telehealth: Payer: Self-pay | Admitting: *Deleted

## 2022-05-19 NOTE — Telephone Encounter (Signed)
   Telephone encounter was:  Unsuccessful.  05/19/2022 Name: Adam Gonzalez MRN: [id number] DOB: [birth year]  Unsuccessful outbound call made today to assist with:  Financial Difficulties related to rent  Outreach Attempt:  1st Attempt  A HIPAA compliant voice message was left requesting a return call.  Instructed patient to call back at [phone].  [place] [phone] [address] , [place] [address] Email : Ashby [email or web address] Greenauer-moran [email or web address]

## 2022-05-22 ENCOUNTER — Telehealth: Payer: Self-pay | Admitting: *Deleted

## 2022-05-22 NOTE — Telephone Encounter (Signed)
   Telephone encounter was:  Successful.  05/22/2022 Name: Adam Gonzalez MRN: [id number] DOB: [birth year]  Adam Gonzalez is a 61 y.o. year old male who is a primary care patient of Minette Brine, [place] . The community resource team was consulted for assistance with Financial Difficulties related to rent  Care guide performed the following interventions: .Patient busy wants me to call back around 12  Follow Up Plan:  Care guide will follow up with patient by phone over the next day  [place] [address] , [place] [address] Email : Ashby [email or web address] Greenauer-moran [email or web address]

## 2022-05-23 ENCOUNTER — Telehealth: Payer: Self-pay | Admitting: *Deleted

## 2022-05-23 NOTE — Telephone Encounter (Signed)
   Telephone encounter was:  Unsuccessful.  05/23/2022 Name: Antonia Culbertson MRN: [id number] DOB: [birth year]  Unsuccessful outbound call made today to assist with:  Food Insecurity and Financial Difficulties related to rent  Outreach Attempt:  2nd Attempt  A HIPAA compliant voice message was left requesting a return call.  Instructed patient to call back at [phone]. [place] [phone] [address] , [place] [address] Email : Ashby Dawes. Greenauer-moran [email or web address]

## 2022-05-24 ENCOUNTER — Ambulatory Visit: Payer: Self-pay | Admitting: Licensed Clinical Social Worker

## 2022-05-24 ENCOUNTER — Telehealth: Payer: Self-pay | Admitting: *Deleted

## 2022-05-24 ENCOUNTER — Telehealth: Payer: Self-pay | Admitting: Licensed Clinical Social Worker

## 2022-05-24 NOTE — Telephone Encounter (Signed)
   Telephone encounter was:  Successful.  05/24/2022 Name: Adam Gonzalez MRN: [id number] DOB: 06-25-1961  Adam Gonzalez is a 61 y.o. year old male who is a primary care patient of Minette Brine, [place] . The community resource team was consulted for assistance with Financial Difficulties related to rent Patient transfered from call from LCSW patient, provided community resources for him to call and will follow up in a few days after he can gather more information about new job  Care guide performed the following interventions: Patient provided with information about care guide support team and interviewed to confirm resource needs Follow up call placed to community resources to determine status of patients referral.  Follow Up Plan:  Client will call back in a few days   [place] [address] , [place] [address] Email : Ashby Dawes. Greenauer-moran [email or web address]

## 2022-05-24 NOTE — Patient Outreach (Signed)
  Care Coordination   05/24/2022 Name: Adam Gonzalez MRN: [id number] DOB: April 19, 1961   Care Coordination Outreach Attempts:  An unsuccessful telephone outreach was attempted for a scheduled appointment today.  Follow Up Plan:  Additional outreach attempts will be made to offer the patient care coordination information and services.   Encounter Outcome:  No Answer  Care Coordination Interventions Activated:  No   Care Coordination Interventions:  No, not indicated    Christa See, MSW, [email or web address] Phone [phone] 1:13 PM

## 2022-05-25 NOTE — Patient Instructions (Signed)
Visit Information  Thank you for taking time to visit with me today. Please don't hesitate to contact me if I can be of assistance to you.   Following are the goals we discussed today:   Goals Addressed             This Visit's Progress    Obtain Assistance with Benefits   On track    Care Coordination Interventions: Solution-Focused Strategies employed:  Active listening / Reflection utilized  Emotional Support Provided Patient endorses financial strain. States that his unemployment benefits will be delayed. He was able to pay a portion of her his rent; however, he has a remaining balance of $1,568 (including water and sewer) Patient completed application at [place] for SNAP benefits. Awareded $179 Patient plans to submit resume to job opportunity. States once submitted, he has high chances of being selected for the position LCSW discussed local agencies that can assist with obtaining employment, such as [employer] collaborated with [employer], Advertising account planner, about local resources for rental and utility resources. Pt was transferred to her for additional resources              Our next appointment is by telephone on 06/13/22 at 1 PM  Please call the care guide team at [phone] if you need to cancel or reschedule your appointment.   If you are experiencing a Mental Health or [place] or need someone to talk to, please call the Suicide and Crisis Lifeline: 988 call 911   The patient verbalized understanding of instructions, educational materials, and care plan provided today and DECLINED offer to receive copy of patient instructions, educational materials, and care plan.   Christa See, MSW, [email or web address] Phone [phone] 11:18 AM

## 2022-05-25 NOTE — Patient Outreach (Signed)
  Care Coordination   Follow Up Visit Note   05/25/2022 Name: Adam Gonzalez MRN: [id number] DOB: 24-Aug-1960  Adam Gonzalez is a 61 y.o. year old male who sees Minette Brine, [place] for primary care. I spoke with  [name] by phone today.  What matters to the patients health and wellness today?  Rental and Utility Assistance    Goals Addressed             This Visit's Progress    Obtain Assistance with Benefits   On track    Care Coordination Interventions: Solution-Focused Strategies employed:  Active listening / Reflection utilized  Emotional Support Provided Patient endorses financial strain. States that his unemployment benefits will be delayed. He was able to pay a portion of her his rent; however, he has a remaining balance of $1,568 (including water and sewer) Patient completed application at [facility] for SNAP benefits. Awareded $179 Patient plans to submit resume to job opportunity. States once submitted, he has high chances of being selected for the position LCSW discussed local agencies that can assist with obtaining employment, such as [employer] collaborated with [employer], Advertising account planner, about local resources for rental and utility resources. Pt was transferred to her for additional resources              SDOH assessments and interventions completed:  Yes  SDOH Interventions Today    Flowsheet Row Most Recent Value  SDOH Interventions   Food Insecurity Interventions Intervention Not Indicated, Assist with SNAP Application  [Pt recently awarded SNAP benefts through DSS]        Care Coordination Interventions Activated:  Yes  Care Coordination Interventions:  Yes, provided   Follow up plan: Follow up call scheduled for 2-4 weeks    Encounter Outcome:  Pt. Visit Completed   Christa See, MSW, [email or web address] Phone [phone] 11:16 AM

## 2022-05-30 ENCOUNTER — Telehealth: Payer: Self-pay | Admitting: *Deleted

## 2022-05-30 NOTE — Telephone Encounter (Signed)
   Telephone encounter was:  Unsuccessful.  05/30/2022 Name: Adam Gonzalez MRN: [id number] DOB: 1960-09-26  Unsuccessful outbound call made today to assist with:  Financial Difficulties related to rent. utilities  Outreach Attempt:  2nd Attempt  A HIPAA compliant voice message was left requesting a return call.  Instructed patient to call back at [phone].  [facility] [phone] [address] , [place] [address] Email : Ashby Dawes. Green[place]-moran [email or web address]

## 2022-05-31 ENCOUNTER — Telehealth: Payer: Self-pay | Admitting: *Deleted

## 2022-05-31 NOTE — Telephone Encounter (Signed)
   Telephone encounter was:  Successful.  05/31/2022 Name: Adam Gonzalez MRN: [id number] DOB: [birth year]  Adam Gonzalez is a 61 y.o. year old male who is a primary care patient of Minette Brine, [place] . The community resource team was consulted for assistance with Transportation Needs   Care guide performed the following interventions: Follow up call placed to the patient to discuss status of referral.  Called patient at number previously used to reach him Emergency contact line, she said that home line better to reach him so called number she provided no response .[phone]  will close patient and alert LCSW of my outreach .     Follow Up Plan:  No further follow up planned at this time. The patient has been provided with needed resources.  [facility] [phone] [address] , [place] [address] Email : Ashby Dawes. Greenauer-moran [email or web address]

## 2022-06-01 ENCOUNTER — Telehealth: Payer: Self-pay | Admitting: Licensed Clinical Social Worker
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
## 2022-06-02 ENCOUNTER — Telehealth: Payer: Self-pay | Admitting: *Deleted

## 2022-06-02 NOTE — Telephone Encounter (Signed)
   Telephone encounter was:  Successful.  06/02/2022 Name: Adam Gonzalez MRN: [id number] DOB: April 06, 1961  Adam Gonzalez is a 61 y.o. year old male who is a primary care patient of Minette Brine, [place] . The community resource team was consulted for assistance with Financial Difficulties related to rent patient initiated contact today after leaving many messages at this point patient has said he followed through with the comminity resources that I provided as well as  360 referral will contact LCSW to see if patient qualifies for patient assistance  Care guide performed the following interventions: Patient provided with information about care guide support team and interviewed to confirm resource needs.  Follow Up Plan:  No further follow up planned at this time. The patient has been provided with needed resources.  [facility] [phone] [address] , [place] [address] Email : Ashby Dawes. Greenauer-moran '@[email or web address]

## 2022-06-13 ENCOUNTER — Ambulatory Visit: Payer: Self-pay | Admitting: Licensed Clinical Social Worker

## 2022-06-15 NOTE — Patient Outreach (Signed)
  Care Coordination   Follow Up Visit Note   06/15/2022 Name: Adam Gonzalez MRN: [id number] DOB: October 13, 1960  Adam Gonzalez is a 61 y.o. year old male who sees Adam Gonzalez, [place] for primary care. I spoke with  Adam Gonzalez by phone today.  What matters to the patients health and wellness today?  Rental Assistance    Goals Addressed             This Visit's Progress    Obtain Assistance with Benefits   On track    Care Coordination Interventions: Solution-Focused Strategies employed:  Active listening / Reflection utilized  Emotional Support Provided Patient spoke with Care Guide regarding rental assistance. Resources were provided via phone Patient states that he has been residing at current residence for approx 4 yrs Patient states he will visit local agency the first of the month, as encouraged by Adam Gonzalez to request rental assistance. Pt has contact information and will follow up with LCSW F/up appt scheduled              Adam Gonzalez and interventions completed:  No     Care Coordination Interventions:  Yes, provided   Follow up plan: Follow up call scheduled for 2 weeks    Encounter Outcome:  Pt. Visit Completed   Adam Gonzalez, MSW, [email or web address] Phone [phone] 10:59 PM

## 2022-06-15 NOTE — Patient Instructions (Signed)
Visit Information  Thank you for taking time to visit with me today. Please don't hesitate to contact me if I can be of assistance to you.   Following are the goals we discussed today:   Goals Addressed             This Visit's Progress    Obtain Assistance with Benefits   On track    Care Coordination Interventions: Solution-Focused Strategies employed:  Active listening / Reflection utilized  Emotional Support Provided Patient spoke with Care Guide regarding rental assistance. Resources were provided via phone Patient states that he has been residing at current residence for approx 4 yrs Patient states he will visit local agency the first of the month, as encouraged by [name] to request rental assistance. Pt has contact information and will follow up with LCSW F/up appt scheduled              Our next appointment is by telephone on 06/20/22 at 1 PM  Please call the care guide team at [phone] if you need to cancel or reschedule your appointment.   If you are experiencing a Mental Health or [place] or need someone to talk to, please call the Suicide and Crisis Lifeline: 988 call 911   The patient verbalized understanding of instructions, educational materials, and care plan provided today and DECLINED offer to receive copy of patient instructions, educational materials, and care plan.   Christa See, MSW, [email or web address] Phone [phone] 11:00 PM

## 2022-06-20 ENCOUNTER — Encounter: Payer: Self-pay | Admitting: Licensed Clinical Social Worker

## 2022-06-20 ENCOUNTER — Telehealth: Payer: Self-pay | Admitting: Licensed Clinical Social Worker

## 2022-06-20 NOTE — Patient Outreach (Signed)
  Care Coordination   06/20/2022 Name: Flynt Breeze MRN: [id number] DOB: 1960/10/14   Care Coordination Outreach Attempts:  An unsuccessful telephone outreach was attempted for a scheduled appointment today.  Follow Up Plan:  Additional outreach attempts will be made to offer the patient care coordination information and services.   Encounter Outcome:  No Answer   Care Coordination Interventions:  No, not indicated    Christa See, MSW, [email or web address] Phone [phone] 1:10 PM

## 2022-07-03 ENCOUNTER — Encounter: Payer: Self-pay | Admitting: Nurse Practitioner
